# Patient Record
Sex: Female | Born: 1990 | Race: Black or African American | Hispanic: No | Marital: Married | Smoking: Current every day smoker
Health system: Southern US, Community
[De-identification: ages and names within clinical notes are randomized; demographics above are authoritative.]

## PROBLEM LIST (undated history)

## (undated) DIAGNOSIS — F419 Anxiety disorder, unspecified: Secondary | ICD-10-CM

## (undated) DIAGNOSIS — E119 Type 2 diabetes mellitus without complications: Secondary | ICD-10-CM

## (undated) HISTORY — DX: Type 2 diabetes mellitus without complications: E11.9

## (undated) HISTORY — PX: WISDOM TOOTH EXTRACTION: SHX21

## (undated) HISTORY — DX: Anxiety disorder, unspecified: F41.9

---
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
## 2018-11-06 ENCOUNTER — Telehealth: Payer: Self-pay | Admitting: Family Medicine

## 2018-11-06 ENCOUNTER — Ambulatory Visit (INDEPENDENT_AMBULATORY_CARE_PROVIDER_SITE_OTHER): Payer: PRIVATE HEALTH INSURANCE | Admitting: Family Medicine

## 2018-11-06 ENCOUNTER — Other Ambulatory Visit: Payer: Self-pay

## 2018-11-06 ENCOUNTER — Encounter: Payer: Self-pay | Admitting: Family Medicine

## 2018-11-06 DIAGNOSIS — N926 Irregular menstruation, unspecified: Secondary | ICD-10-CM | POA: Diagnosis not present

## 2018-11-06 DIAGNOSIS — R42 Dizziness and giddiness: Secondary | ICD-10-CM | POA: Diagnosis not present

## 2018-11-06 DIAGNOSIS — Z319 Encounter for procreative management, unspecified: Secondary | ICD-10-CM | POA: Insufficient documentation

## 2018-11-06 NOTE — Telephone Encounter (Signed)
Please schedule lab visit for patient  Thanks!  LG

## 2018-11-06 NOTE — Progress Notes (Signed)
Patient ID: Tara Rose, female   DOB: 12/25/1990, 28 y.o.   MRN: [id number]    Virtual Visit via video Note  This visit type was conducted due to national recommendations for restrictions regarding the COVID-19 pandemic (e.g. social distancing).  This format is felt to be most appropriate for this patient at this time.  All issues noted in this document were discussed and addressed.  No physical exam was performed (except for noted visual exam findings with Video Visits).   I connected with Tara Rose today at  8:40 AM EDT by a video enabled telemedicine application and verified that I am speaking with the correct person using two identifiers. Location patient: home Location provider: work or home office Persons participating in the virtual visit: patient, provider  I discussed the limitations, risks, security and privacy concerns of performing an evaluation and management service by telephone and the availability of in person appointments. I also discussed with the patient that there may be a patient responsible charge related to this service. The patient expressed understanding and agreed to proceed.   HPI:  Patient and I connected via video for her to establish with PCP.  Main concern today is irregular menstrual cycle and desire to become pregnant.  Patient has dealt with an irregular menstrual cycle for years.  States she did try an oral birth control for short time, but did not like how it made her feel so stopped taking.  States she will go 3 and 4 months without a cycle, then there will be times where she will have what seems to be a normal.  However she will spot for months after that.   Also reports some dizziness at times.  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  Upon straightening her neck she will feel dizziness wash over  her.  Usually will take some deep breaths drink some water and the dizziness will subside.  Denies any loss of visual field or extremity weakness, denies any facial weakness or speech difficulty denies any sharp pain in head.  No fever or chills.  No cough, shortness of breath or wheezing.  No chest pain.  No urinary problems.  No nausea, vomiting or diarrhea.  Past medical, social, surgical and family history reviewed and updated accordingly in chart.  ROS: See pertinent positives and negatives per HPI.  History reviewed. No pertinent past medical history.  History reviewed. No pertinent surgical history.  History reviewed. No pertinent family history.   Social History   Tobacco Use  . Smoking status: Former Games developermoker  . Smokeless tobacco: Never Used  Substance Use Topics  . Alcohol use: Never    Frequency: Never    EXAM:  GENERAL: alert, oriented, appears well and in no acute distress  HEENT: atraumatic, conjunttiva clear, no obvious abnormalities on inspection of external nose and ears  NECK: normal movements of the head and neck  LUNGS: on inspection no signs of respiratory distress, breathing rate appears normal, no obvious gross SOB, gasping or wheezing  CV: no obvious cyanosis  MS: moves all visible extremities without noticeable abnormality  PSYCH/NEURO: pleasant and cooperative, no obvious depression or anxiety, speech and thought processing grossly intact  ASSESSMENT AND PLAN:  Discussed the following assessment and plan:  Irregular menses/desire to become pregnant-advised patient there are different options  we can try including different formulations of birth control to help regulate the.  However if her goal is to become pregnant then I recommend seeing OB/GYN specialist for further work-up and direction on how best to approach pregnancy.  Dizziness-patient's description of dizziness does sound like a positional vertigo, and advised to try taking in a oral  nondrowsy antihistamine such as Claritin or Allegra daily to see if this reduces that symptom.  Also discussed keeping self well-hydrated and changing positions slowly.  We will also get blood work to further work-up other causes of dizziness.   I discussed the assessment and treatment plan with the patient. The patient was provided an opportunity to ask questions and all were answered. The patient agreed with the plan and demonstrated an understanding of the instructions.   The patient was advised to call back or seek an in-person evaluation if the symptoms worsen or if the condition fails to improve as anticipated.  30 minutes spent over video call with patient discussing possible causes of irregular menses and approaches we can take to help regulate the menstrual cycle as well as approaches for pregnancy.  Also discussed different medications to help reduce feelings of dizziness, patient is not a fan of taking a lot of medications, so she will be sure and keep self well-hydrated, change positions slowly and we will do blood work to rule out any other possible dizziness causes.   Jodelle Green, FNP

## 2018-11-06 NOTE — Telephone Encounter (Signed)
Appt scheduled for 11/08/18 @ 3:45pm

## 2018-11-08 ENCOUNTER — Other Ambulatory Visit: Payer: Self-pay

## 2018-11-08 ENCOUNTER — Other Ambulatory Visit (INDEPENDENT_AMBULATORY_CARE_PROVIDER_SITE_OTHER): Payer: PRIVATE HEALTH INSURANCE

## 2018-11-08 DIAGNOSIS — R42 Dizziness and giddiness: Secondary | ICD-10-CM | POA: Diagnosis not present

## 2018-11-08 DIAGNOSIS — E559 Vitamin D deficiency, unspecified: Secondary | ICD-10-CM

## 2018-11-08 NOTE — Addendum Note (Signed)
Addended by: WRIGHT, LATOYA S on: 11/08/2018 01:44 PM   Modules accepted: Orders  

## 2018-11-09 LAB — CBC WITH DIFFERENTIAL/PLATELET
Basophils Absolute: 0.1 10*3/uL (ref 0.0–0.2)
Basos: 0 %
EOS (ABSOLUTE): 0.2 10*3/uL (ref 0.0–0.4)
Eos: 1 %
Hematocrit: 41.7 % (ref 34.0–46.6)
Hemoglobin: 14.2 g/dL (ref 11.1–15.9)
Immature Grans (Abs): 0 10*3/uL (ref 0.0–0.1)
Immature Granulocytes: 0 %
Lymphocytes Absolute: 4.4 10*3/uL — ABNORMAL HIGH (ref 0.7–3.1)
Lymphs: 28 %
MCH: 27.6 pg (ref 26.6–33.0)
MCHC: 34.1 g/dL (ref 31.5–35.7)
MCV: 81 fL (ref 79–97)
Monocytes Absolute: 1 10*3/uL — ABNORMAL HIGH (ref 0.1–0.9)
Monocytes: 7 %
Neutrophils Absolute: 10.1 10*3/uL — ABNORMAL HIGH (ref 1.4–7.0)
Neutrophils: 64 %
Platelets: 355 10*3/uL (ref 150–450)
RBC: 5.14 x10E6/uL (ref 3.77–5.28)
RDW: 13.9 % (ref 11.7–15.4)
WBC: 15.9 10*3/uL — ABNORMAL HIGH (ref 3.4–10.8)

## 2018-11-09 LAB — B12 AND FOLATE PANEL
Folate: 18.6 ng/mL (ref >3.0)
Vitamin B-12: 692 pg/mL (ref 232–1245)

## 2018-11-09 LAB — COMPREHENSIVE METABOLIC PANEL
ALT: 34 IU/L — ABNORMAL HIGH (ref 0–32)
AST: 23 IU/L (ref 0–40)
Albumin/Globulin Ratio: 1.4 (ref 1.2–2.2)
Albumin: 4.8 g/dL (ref 3.9–5.0)
Alkaline Phosphatase: 86 IU/L (ref 39–117)
BUN/Creatinine Ratio: 10 (ref 9–23)
BUN: 7 mg/dL (ref 6–20)
Bilirubin Total: 0.5 mg/dL (ref 0.0–1.2)
CO2: 20 mmol/L (ref 20–29)
Calcium: 9.9 mg/dL (ref 8.7–10.2)
Chloride: 98 mmol/L (ref 96–106)
Creatinine, Ser: 0.67 mg/dL (ref 0.57–1.00)
GFR calc Af Amer: 141 mL/min/{1.73_m2} (ref >59)
GFR calc non Af Amer: 123 mL/min/{1.73_m2} (ref >59)
Globulin, Total: 3.4 g/dL (ref 1.5–4.5)
Glucose: 171 mg/dL — ABNORMAL HIGH (ref 65–99)
Potassium: 4 mmol/L (ref 3.5–5.2)
Sodium: 141 mmol/L (ref 134–144)
Total Protein: 8.2 g/dL (ref 6.0–8.5)

## 2018-11-09 LAB — VITAMIN D 25 HYDROXY (VIT D DEFICIENCY, FRACTURES): Vit D, 25-Hydroxy: 12.6 ng/mL — ABNORMAL LOW (ref 30.0–100.0)

## 2018-11-09 LAB — TSH: TSH: 1.85 u[IU]/mL (ref 0.450–4.500)

## 2018-11-11 ENCOUNTER — Encounter: Payer: Self-pay | Admitting: Family Medicine

## 2018-11-11 DIAGNOSIS — D72829 Elevated white blood cell count, unspecified: Secondary | ICD-10-CM

## 2018-11-11 MED ORDER — SULFAMETHOXAZOLE-TRIMETHOPRIM 800-160 MG PO TABS: 1.0000 | ORAL_TABLET | Freq: Two times a day (BID) | ORAL | 0 refills | Status: DC

## 2018-11-11 MED ORDER — VITAMIN D (ERGOCALCIFEROL) 1.25 MG (50000 UNIT) PO CAPS: 50000.0000 [IU] | ORAL_CAPSULE | ORAL | 0 refills | Status: DC

## 2018-11-11 NOTE — Addendum Note (Signed)
Addended by: Philis Nettle on: 11/11/2018 03:59 PM   Modules accepted: Orders

## 2018-11-11 NOTE — Telephone Encounter (Signed)
Appt scheduled 11/25/18 @ 3:00pm

## 2018-11-11 NOTE — Progress Notes (Addendum)
Vitamin D Rx

## 2018-11-11 NOTE — Telephone Encounter (Signed)
Please set up lab appt for 2 weeks from now  Thanks  LG

## 2018-11-25 ENCOUNTER — Encounter: Payer: Self-pay | Admitting: Family Medicine

## 2018-11-25 ENCOUNTER — Other Ambulatory Visit (INDEPENDENT_AMBULATORY_CARE_PROVIDER_SITE_OTHER): Payer: PRIVATE HEALTH INSURANCE

## 2018-11-25 ENCOUNTER — Other Ambulatory Visit: Payer: Self-pay

## 2018-11-25 DIAGNOSIS — D72829 Elevated white blood cell count, unspecified: Secondary | ICD-10-CM

## 2018-11-26 ENCOUNTER — Other Ambulatory Visit: Payer: Self-pay | Admitting: Family Medicine

## 2018-11-26 DIAGNOSIS — D72829 Elevated white blood cell count, unspecified: Secondary | ICD-10-CM

## 2018-11-26 LAB — CBC WITH DIFFERENTIAL/PLATELET
Basophils Absolute: 0.1 10*3/uL (ref 0.0–0.1)
Basophils Relative: 0.7 % (ref 0.0–3.0)
Eosinophils Absolute: 0.1 10*3/uL (ref 0.0–0.7)
Eosinophils Relative: 0.9 % (ref 0.0–5.0)
HCT: 42.4 % (ref 36.0–46.0)
Hemoglobin: 14.3 g/dL (ref 12.0–15.0)
Lymphocytes Relative: 28 % (ref 12.0–46.0)
Lymphs Abs: 4.1 10*3/uL — ABNORMAL HIGH (ref 0.7–4.0)
MCHC: 33.7 g/dL (ref 30.0–36.0)
MCV: 83.9 fl (ref 78.0–100.0)
Monocytes Absolute: 0.9 10*3/uL (ref 0.1–1.0)
Monocytes Relative: 6.1 % (ref 3.0–12.0)
Neutro Abs: 9.3 10*3/uL — ABNORMAL HIGH (ref 1.4–7.7)
Neutrophils Relative %: 64.3 % (ref 43.0–77.0)
Platelets: 319 10*3/uL (ref 150.0–400.0)
RBC: 5.05 Mil/uL (ref 3.87–5.11)
RDW: 14.3 % (ref 11.5–15.5)
WBC: 14.5 10*3/uL — ABNORMAL HIGH (ref 4.0–10.5)

## 2018-11-27 ENCOUNTER — Ambulatory Visit (INDEPENDENT_AMBULATORY_CARE_PROVIDER_SITE_OTHER): Payer: PRIVATE HEALTH INSURANCE | Admitting: Certified Nurse Midwife

## 2018-11-27 ENCOUNTER — Other Ambulatory Visit: Payer: Self-pay

## 2018-11-27 ENCOUNTER — Encounter: Payer: Self-pay | Admitting: Certified Nurse Midwife

## 2018-11-27 VITALS — BP 128/90 | HR 106 | Ht 61.0 in | Wt 192.4 lb

## 2018-11-27 DIAGNOSIS — N970 Female infertility associated with anovulation: Secondary | ICD-10-CM | POA: Diagnosis not present

## 2018-11-27 NOTE — Progress Notes (Signed)
Subjective:    Tara Rose is a 28 y.o. female who presents for evaluation of infertility. Patient and partner have been attempting conception for 6 years. Marital status: married for 6 years. Pregnancies with current partner: no. Body mass index is 36.35 kg/m.  Menstrual and Endocrine History LMP Patient's last menstrual period was 11/16/2018 (exact date).  Menarche 12        Duration of flow week long with spotting sometimes for months days  Heavy menses no  Clots yes  Intermenstrual bleeding yes  Postcoital bleeding yes  Dysmenorrhea no  Amenorrhea Skip 1- 5 months  Weight change yes, started at 18  Hirsutism no  Balding no  Acne no  Galactorrhea no   Obstetrical History Never pregnant  Gynecologic History Last PAP Never had one  Previous abdominal or pelvic surgery no  Pelvic pain no  Endometriosis no  Hot flashes no  DES exposure no  Abnormal Pap never had pap  Cervix Cryo/cone no  Sexually transmitted diseases no  Pelvic inflammatory disease no   Infertility and Endocrine Studies None   Contraception None   Habits Cigarettes:    Wife -  in the past, not current    Husband - yes Alcohol:    Wife -  no    Husband - no Marijuana:   Wife - no   Husband - no  The following portions of the patient's history were reviewed and updated as appropriate: allergies, current medications, past family history, past medical history, past social history, past surgical history and problem list.  Review of Systems Pertinent items are noted in HPI.   Marital History: married # of years with this partner: 6 # of partners: a few            Objective:    Female Exam BP 128/90   Pulse (!) 106   Ht '5\' 1"'  (1.549 m)   Wt 192 lb 6 oz (87.3 kg)   LMP 11/16/2018 (Exact Date)   BMI 36.35 kg/m  Wt Readings from Last 1 Encounters:  11/27/18 192 lb 6 oz (87.3 kg)   BMI: Body mass index is 36.35 kg/m. No exam performed today, not indicated.  .   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   Plan:    labs and ultrasound ordered today.   Encourage pt partner to have semen analysis. Discussed use of ovulation kit and timing of intercourse. Will follow up with results. Discussed causes of irregular periods including PCOS. Discussed plan of care and use of clomid to induce ovulation . She verbalizes and agrees to plan. I offered her a referral to infertility but she would like to try plan as discussed piror to seeking infertility specialist .  I attest that more than 50% of visit spent reviewing history, discussing cause of irregular periods and infertility , developing a plan of care, and answering all of her questions. Face to face time 20 min.   Philip Aspen, CNm

## 2018-11-27 NOTE — Patient Instructions (Signed)
Female Infertility  Female infertility refers to a woman's inability to get pregnant (conceive) after a year of having sex regularly (or after 6 months in women over age 26) without using birth control. Infertility can also mean that a woman is not able to carry a pregnancy to full term. Both women and men can have fertility problems. What are the causes? This condition may be caused by:  Problems with reproductive organs. Infertility can result if a woman: ? Has an abnormally short cervix or a cervix that does not remain closed during a pregnancy. ? Has a blockage or scarring in the fallopian tubes. ? Has an abnormally shaped uterus. ? Has uterine fibroids. This is a benign mass of tissue or muscle (tumor) that can develop in the uterus. ? Is not ovulating in a regular way.  Certain medical conditions. These may include: ? Polycystic ovary syndrome (PCOS). This is a hormonal disorder that can cause small cysts to grow on the ovaries. This is the most common cause of infertility in women. ? Endometriosis. This is a condition in which the tissue that lines the uterus (endometrium) grows outside of its normal location. ? Cancer and cancer treatments, such as chemotherapy or radiation. ? Premature ovarian failure. This is when ovaries stop producing eggs and hormones before age 40. ? Sexually transmitted diseases, such as chlamydia or gonorrhea. ? Autoimmune disorders. These are disorders in which the body's defense system (immune system) attacks normal, healthy cells. Infertility can be linked to more than one cause. For some women, the cause of infertility is not known (unexplained infertility). What increases the risk?  Age. A woman's fertility declines with age, especially after her mid-30s.  Being underweight or overweight.  Drinking too much alcohol.  Using drugs such as anabolic steroids, cocaine, and marijuana.  Exercising excessively.  Being exposed to environmental toxins,  such as radiation, pesticides, and certain chemicals. What are the signs or symptoms? The main sign of infertility in women is the inability to get pregnant or carry a pregnancy to full term. How is this diagnosed? This condition may be diagnosed by:  Checking whether you are ovulating each month. The tests may include: ? Blood tests to check hormone levels. ? An ultrasound of the ovaries. ? Taking a small tissue that lines the uterus and checking it under a microscope (endometrial biopsy).  Doing additional tests. This is done if ovulation is normal. Tests may include: ? Hysterosalpingography. This X-ray test can show the shape of the uterus and whether the fallopian tubes are open. ? Laparoscopy. This test uses a lighted tube (laparoscope) to look for problems in the fallopian tubes and other organs. ? Transvaginal ultrasound. This imaging test is used to check for abnormalities in the uterus and ovaries. ? Hysteroscopy. This test uses a lighted tube to check for problems in the cervix and the uterus. To be diagnosed with infertility, both partners will have a physical exam. Both partners will also have an extensive medical and sexual history taken. Additional tests may be done. How is this treated? Treatment depends on the cause of infertility. Most cases of infertility in women are treated with medicine or surgery.  Women may take medicine to: ? Correct ovulation problems. ? Treat other health conditions.  Surgery may be done to: ? Repair damage to the ovaries, fallopian tubes, cervix, or uterus. ? Remove growths from the uterus. ? Remove scar tissue from the uterus, pelvis, or other organs. Assisted reproductive technology (ART) Assisted reproductive technology (  ART) refers to all treatments and procedures that combine eggs and sperm outside the body to try to help a couple conceive. ART is often combined with fertility drugs to stimulate ovulation. Sometimes ART is done using eggs  retrieved from another woman's body (donor eggs) or from previously frozen fertilized eggs (embryos). There are different types of ART. These include:  Intrauterine insemination (IUI). A long, thin tube is used to place sperm directly into a woman's uterus. This procedure: ? Is effective for infertility caused by sperm problems, including low sperm count and low motility. ? Can be used in combination with fertility drugs.  In vitro fertilization (IVF). This is done when a woman's fallopian tubes are blocked or when a man has low sperm count. In this procedure: ? Fertility drugs are used to stimulate the ovaries to produce multiple eggs. ? Once mature, these eggs are removed from the body and combined with the sperm to be fertilized. ? The fertilized eggs are then placed into the woman's uterus. Follow these instructions at home:  Take over-the-counter and prescription medicines only as told by your health care provider.  Do not use any products that contain nicotine or tobacco, such as cigarettes and e-cigarettes. If you need help quitting, ask your health care provider.  If you drink alcohol, limit how much you have to 1 drink a day.  Make dietary changes to lose weight or maintain a healthy weight. Work with your health care provider and a dietitian to set a weight-loss goal that is healthy and reasonable for you.  Seek support from a counselor or support group to talk about your concerns related to infertility. Couples counseling may be helpful for you and your partner.  Practice stress reduction techniques that work well for you, such as regular physical activity, meditation, or deep breathing.  Keep all follow-up visits as told by your health care provider. This is important. Contact a health care provider if you:  Feel that stress is interfering with your life and relationships.  Have side effects from treatments for infertility. Summary  Female infertility refers to a woman's  inability to get pregnant (conceive) after a year of having sex regularly (or after 6 months in women over age 26) without using birth control.  To be diagnosed with infertility, both partners will have a physical exam. Both partners will also have an extensive medical and sexual history taken.  Seek support from a counselor or support group to talk about your concerns related to infertility. Couples counseling may be helpful for you and your partner. This information is not intended to replace advice given to you by your health care provider. Make sure you discuss any questions you have with your health care provider. Document Released: 04/06/2003 Document Revised: 07/25/2018 Document Reviewed: 03/05/2017 Elsevier Patient Education  2020 Elsevier Inc.  

## 2018-11-28 ENCOUNTER — Ambulatory Visit (INDEPENDENT_AMBULATORY_CARE_PROVIDER_SITE_OTHER): Payer: PRIVATE HEALTH INSURANCE

## 2018-11-28 DIAGNOSIS — N970 Female infertility associated with anovulation: Secondary | ICD-10-CM

## 2018-11-29 ENCOUNTER — Other Ambulatory Visit: Payer: Self-pay

## 2018-11-29 ENCOUNTER — Other Ambulatory Visit: Payer: PRIVATE HEALTH INSURANCE

## 2018-11-29 DIAGNOSIS — N970 Female infertility associated with anovulation: Secondary | ICD-10-CM

## 2018-11-30 LAB — FSH/LH
FSH: 7.8 m[IU]/mL
LH: 12.7 m[IU]/mL

## 2018-11-30 LAB — CBC
Hematocrit: 42.5 % (ref 34.0–46.6)
Hemoglobin: 14.5 g/dL (ref 11.1–15.9)
MCH: 27.5 pg (ref 26.6–33.0)
MCHC: 34.1 g/dL (ref 31.5–35.7)
MCV: 81 fL (ref 79–97)
Platelets: 335 10*3/uL (ref 150–450)
RBC: 5.28 x10E6/uL (ref 3.77–5.28)
RDW: 14 % (ref 11.7–15.4)
WBC: 15.6 10*3/uL — ABNORMAL HIGH (ref 3.4–10.8)

## 2018-11-30 LAB — PROLACTIN: Prolactin: 13.3 ng/mL (ref 4.8–23.3)

## 2018-11-30 LAB — TESTOSTERONE: Testosterone: 48 ng/dL (ref 8–48)

## 2018-12-11 ENCOUNTER — Other Ambulatory Visit (INDEPENDENT_AMBULATORY_CARE_PROVIDER_SITE_OTHER): Payer: PRIVATE HEALTH INSURANCE

## 2018-12-11 ENCOUNTER — Other Ambulatory Visit: Payer: Self-pay

## 2018-12-11 DIAGNOSIS — D72829 Elevated white blood cell count, unspecified: Secondary | ICD-10-CM

## 2018-12-12 ENCOUNTER — Encounter: Payer: Self-pay | Admitting: Family Medicine

## 2018-12-12 DIAGNOSIS — D72829 Elevated white blood cell count, unspecified: Secondary | ICD-10-CM

## 2018-12-12 LAB — CBC WITH DIFFERENTIAL/PLATELET
Basophils Absolute: 0.2 10*3/uL — ABNORMAL HIGH (ref 0.0–0.1)
Basophils Relative: 1.1 % (ref 0.0–3.0)
Eosinophils Absolute: 0.3 10*3/uL (ref 0.0–0.7)
Eosinophils Relative: 2 % (ref 0.0–5.0)
HCT: 40.1 % (ref 36.0–46.0)
Hemoglobin: 13.6 g/dL (ref 12.0–15.0)
Lymphocytes Relative: 31 % (ref 12.0–46.0)
Lymphs Abs: 4.4 10*3/uL — ABNORMAL HIGH (ref 0.7–4.0)
MCHC: 33.9 g/dL (ref 30.0–36.0)
MCV: 83.2 fl (ref 78.0–100.0)
Monocytes Absolute: 0.8 10*3/uL (ref 0.1–1.0)
Monocytes Relative: 5.7 % (ref 3.0–12.0)
Neutro Abs: 8.6 10*3/uL — ABNORMAL HIGH (ref 1.4–7.7)
Neutrophils Relative %: 60.2 % (ref 43.0–77.0)
Platelets: 311 10*3/uL (ref 150.0–400.0)
RBC: 4.82 Mil/uL (ref 3.87–5.11)
RDW: 14.2 % (ref 11.5–15.5)
WBC: 14.2 10*3/uL — ABNORMAL HIGH (ref 4.0–10.5)

## 2018-12-17 ENCOUNTER — Telehealth: Payer: Self-pay | Admitting: Family Medicine

## 2018-12-17 NOTE — Telephone Encounter (Signed)
I spoke with pt to schedule her CBC non-fasting lab appt in two weeks. Pt states she does not know what her schedule will be and will call back to schedule.

## 2019-01-26 ENCOUNTER — Other Ambulatory Visit: Payer: Self-pay | Admitting: Family Medicine

## 2019-01-26 DIAGNOSIS — E559 Vitamin D deficiency, unspecified: Secondary | ICD-10-CM

## 2019-03-08 ENCOUNTER — Other Ambulatory Visit: Payer: Self-pay

## 2019-03-08 DIAGNOSIS — Z20822 Contact with and (suspected) exposure to covid-19: Secondary | ICD-10-CM

## 2019-03-09 ENCOUNTER — Other Ambulatory Visit: Payer: Self-pay

## 2019-03-09 ENCOUNTER — Encounter (INDEPENDENT_AMBULATORY_CARE_PROVIDER_SITE_OTHER): Payer: Self-pay

## 2019-03-09 ENCOUNTER — Emergency Department: Payer: PRIVATE HEALTH INSURANCE

## 2019-03-09 ENCOUNTER — Emergency Department
Admission: EM | Admit: 2019-03-09 | Discharge: 2019-03-09 | Disposition: A | Discharge status: Home / Self Care | Payer: PRIVATE HEALTH INSURANCE | Attending: Emergency Medicine | Admitting: Emergency Medicine

## 2019-03-09 ENCOUNTER — Telehealth: Payer: PRIVATE HEALTH INSURANCE | Admitting: Family

## 2019-03-09 ENCOUNTER — Encounter: Payer: Self-pay | Admitting: *Deleted

## 2019-03-09 DIAGNOSIS — Z20822 Contact with and (suspected) exposure to covid-19: Secondary | ICD-10-CM

## 2019-03-09 DIAGNOSIS — J069 Acute upper respiratory infection, unspecified: Secondary | ICD-10-CM | POA: Insufficient documentation

## 2019-03-09 DIAGNOSIS — Z87891 Personal history of nicotine dependence: Secondary | ICD-10-CM | POA: Insufficient documentation

## 2019-03-09 DIAGNOSIS — Z20828 Contact with and (suspected) exposure to other viral communicable diseases: Secondary | ICD-10-CM

## 2019-03-09 MED ORDER — ALBUTEROL SULFATE HFA 108 (90 BASE) MCG/ACT IN AERS: 2.0000 | INHALATION_SPRAY | Freq: Four times a day (QID) | RESPIRATORY_TRACT | 0 refills | Status: DC | PRN

## 2019-03-09 MED ORDER — ACETAMINOPHEN 500 MG PO TABS
1000.0000 mg | ORAL_TABLET | Freq: Once | ORAL | Status: AC
  Administered 2019-03-09: 1000 mg via ORAL

## 2019-03-09 MED ORDER — BENZONATATE 100 MG PO CAPS: 100.0000 mg | ORAL_CAPSULE | Freq: Three times a day (TID) | ORAL | 0 refills | Status: DC | PRN

## 2019-03-09 MED ORDER — ACETAMINOPHEN 500 MG PO TABS
ORAL_TABLET | ORAL | Status: AC
  Filled 2019-03-09: qty 2

## 2019-03-09 NOTE — ED Notes (Signed)
Concerned for COVID exposure, needs testing, husband also here for same. C/o cough, fever, HA

## 2019-03-09 NOTE — Progress Notes (Signed)
E-Visit for Corona Virus Screening   Your current symptoms could be consistent with the coronavirus.  Many health care providers can now test patients at their office but not all are.  [facility] has multiple testing sites. For information on our COVID testing locations and hours go to [email or web address]  Please quarantine yourself while awaiting your test results.  We are enrolling you in our MyChart Home [facility] for COVID19 . Daily you will receive a questionnaire within the MyChart website. Our COVID 19 response team willl be monitoriing your responses daily. Please continue good preventive care measures, including:  frequent hand-washing, avoid touching your face, cover coughs/sneezes, stay out of crowds and keep a 6 foot distance from others.    You can go to one of the testing sites listed below, while they are opened (see hours). You do not need an order and will stay in your car during the test. You do need to self isolate until your results return and if positive 14 days from when your symptoms started and until you are 3 days fever free.   Testing Locations (Monday - Friday, 10 a.m. - 3 p.m.) . [place]: [facility], [address], [place], [place]  . [place]: [facility], [address], [place], [place] (entrance off [address])  . [place]: (Closed each Monday): Testing site relocated to the short stay covered drive at [facility]. (Use the [address] entrance to [facility] next to [facility].)    COVID-19 is a respiratory illness with symptoms that are similar to the flu. Symptoms are typically mild to moderate, but there have been cases of severe illness and death due to the virus. The following symptoms may appear 2-14 days after exposure: . Fever . Cough . Shortness of breath or difficulty breathing . Chills . Repeated shaking with  chills . Muscle pain . Headache . Sore throat . New loss of taste or smell . Fatigue . Congestion or runny nose . Nausea or vomiting . Diarrhea  If you develop fever/cough/breathlessness, please stay home for 10 days with improving symptoms and until you have had 24 hours of no fever (without taking a fever reducer).  Go to the nearest hospital ED for assessment if fever/cough/breathlessness are severe or illness seems like a threat to life.  It is vitally important that if you feel that you have an infection such as this virus or any other virus that you stay home and away from places where you may spread it to others.  You should avoid contact with people age 65 and older.   You should wear a mask or cloth face covering over your nose and mouth if you must be around other people or animals, including pets (even at home). Try to stay at least 6 feet away from other people. This will protect the people around you.  You can use medication such as A prescription cough medication called Tessalon Perles 100 mg. You may take 1-2 capsules every 8 hours as needed for cough and A prescription inhaler called Albuterol MDI 90 mcg /actuation 2 puffs every 4 hours as needed for shortness of breath, wheezing, cough.  You may also take acetaminophen (Tylenol) as needed for fever.   Reduce your risk of any infection by using the same precautions used for avoiding the common cold or flu:  . Wash your hands often with soap and warm water for at least 20 seconds.    If soap and water are not readily available, use an alcohol-based hand sanitizer with at least 60% alcohol.  . If coughing or sneezing, cover your mouth and nose by coughing or sneezing into the elbow areas of your shirt or coat, into a tissue or into your sleeve (not your hands). . Avoid shaking hands with others and consider head nods or verbal greetings only. . Avoid touching your eyes, nose, or mouth with unwashed hands.  . Avoid close contact  with people who are sick. . Avoid places or events with large numbers of people in one location, like concerts or sporting events. . Carefully consider travel plans you have or are making. . If you are planning any travel outside or inside the [place], visit the CDC's Travelers' Health webpage for the latest health notices. . If you have some symptoms but not all symptoms, continue to monitor at home and seek medical attention if your symptoms worsen. . If you are having a medical emergency, call 911.  HOME CARE . Only take medications as instructed by your medical team. . Drink plenty of fluids and get plenty of rest. . A steam or ultrasonic humidifier can help if you have congestion.   GET HELP RIGHT AWAY IF YOU HAVE EMERGENCY WARNING SIGNS** FOR COVID-19. If you or someone is showing any of these signs seek emergency medical care immediately. Call 911 or proceed to your closest emergency facility if: . You develop worsening high fever. . Trouble breathing . Bluish lips or face . Persistent pain or pressure in the chest . New confusion . Inability to wake or stay awake . You cough up blood. . Your symptoms become more severe  **This list is not all possible symptoms. Contact your medical provider for any symptoms that are sever or concerning to you.   MAKE SURE YOU   Understand these instructions.  Will watch your condition.  Will get help right away if you are not doing well or get worse.  Your e-visit answers were reviewed by a board certified advanced clinical practitioner to complete your personal care plan.  Depending on the condition, your plan could have included both over the counter or prescription medications.  If there is a problem please reply once you have received a response from your provider.  Your safety is important to us.  If you have drug allergies check your prescription carefully.    You can use MyChart to ask questions about today's visit, request a non-urgent  call back, or ask for a work or school excuse for 24 hours related to this e-Visit. If it has been greater than 24 hours you will need to follow up with your provider, or enter a new e-Visit to address those concerns. You will get an e-mail in the next two days asking about your experience.  I hope that your e-visit has been valuable and will speed your recovery. Thank you for using e-visits.   Approximately 5 minutes was spent documenting and reviewing patient's chart.   

## 2019-03-09 NOTE — ED Triage Notes (Signed)
Pt reports exposure to covid.  Pt has intermittent fevers, cough, h/a's.  Pt alert

## 2019-03-10 ENCOUNTER — Encounter (INDEPENDENT_AMBULATORY_CARE_PROVIDER_SITE_OTHER): Payer: Self-pay

## 2019-03-10 ENCOUNTER — Telehealth: Payer: Self-pay

## 2019-03-10 LAB — NOVEL CORONAVIRUS, NAA: SARS-CoV-2, NAA: DETECTED — AB

## 2019-03-10 NOTE — Telephone Encounter (Signed)
Received call from patient checking Covid results.  Advised results are positive.  Advised to contact PCP or visit ED if symptoms worsen.   

## 2019-03-11 ENCOUNTER — Encounter (INDEPENDENT_AMBULATORY_CARE_PROVIDER_SITE_OTHER): Payer: Self-pay

## 2019-03-12 ENCOUNTER — Encounter (INDEPENDENT_AMBULATORY_CARE_PROVIDER_SITE_OTHER): Payer: Self-pay

## 2019-03-12 ENCOUNTER — Telehealth: Payer: Self-pay

## 2019-03-12 NOTE — Telephone Encounter (Signed)
Left a vm for patient to callback in regards to [employer].

## 2019-03-13 ENCOUNTER — Encounter (INDEPENDENT_AMBULATORY_CARE_PROVIDER_SITE_OTHER): Payer: Self-pay

## 2019-03-14 ENCOUNTER — Encounter (INDEPENDENT_AMBULATORY_CARE_PROVIDER_SITE_OTHER): Payer: Self-pay

## 2019-03-15 ENCOUNTER — Encounter (INDEPENDENT_AMBULATORY_CARE_PROVIDER_SITE_OTHER): Payer: Self-pay

## 2019-03-17 ENCOUNTER — Encounter (INDEPENDENT_AMBULATORY_CARE_PROVIDER_SITE_OTHER): Payer: Self-pay

## 2019-03-18 ENCOUNTER — Encounter (INDEPENDENT_AMBULATORY_CARE_PROVIDER_SITE_OTHER): Payer: Self-pay

## 2019-03-20 ENCOUNTER — Encounter (INDEPENDENT_AMBULATORY_CARE_PROVIDER_SITE_OTHER): Payer: Self-pay

## 2019-03-21 ENCOUNTER — Encounter (INDEPENDENT_AMBULATORY_CARE_PROVIDER_SITE_OTHER): Payer: Self-pay

## 2019-04-11 NOTE — ED Provider Notes (Signed)
Emergency Department Provider Note  ____________________________________________  Time seen: Approximately 3:47 PM  I have reviewed the triage vital signs and the nursing notes.   HISTORY  Chief Complaint Cough   Historian Patient    HPI Tara Rose is a 28 y.o. female presents to the emergency department with rhinorrhea, nasal congestion nonproductive cough for the past 1 to 2 days.  Patient's husband has similar symptoms.  Patient is requesting COVID-19 testing.  She denies chest pain, chest tightness or abdominal pain.   No past medical history on file.   Immunizations up to date:  Yes.     No past medical history on file.  Patient Active Problem List   Diagnosis Date Noted  . Irregular menses 11/06/2018  . Desire for pregnancy 11/06/2018    Past Surgical History:  Procedure Laterality Date  . WISDOM TOOTH EXTRACTION      Prior to Admission medications   Medication Sig Start Date End Date Taking? Authorizing Provider  albuterol (VENTOLIN HFA) 108 (90 Base) MCG/ACT inhaler Inhale 2 puffs into the lungs every 6 (six) hours as needed for wheezing or shortness of breath. 03/09/19   Junie Spencer, FNP  benzonatate (TESSALON PERLES) 100 MG capsule Take 1 capsule (100 mg total) by mouth 3 (three) times daily as needed. 03/09/19   Junie Spencer, FNP  chlorhexidine (PERIDEX) 0.12 % solution SWISH AND SPIT 15 ML PO BID FOR 30 SECONDS 11/02/18   [provider]  metroNIDAZOLE (FLAGYL) 500 MG tablet TK 1 T PO QID FOR 7 DAYS 11/23/18   [provider]  Vitamin D, Ergocalciferol, (DRISDOL) 1.25 MG (50000 UT) CAPS capsule TAKE 1 CAPSULE (50,000 UNITS TOTAL) BY MOUTH EVERY 7 (SEVEN) DAYS. 01/27/19   Tracey Harries, FNP    Allergies Patient has no known allergies.  Family History  Problem Relation Age of Onset  . Seizures Brother   . Cancer Maternal Grandmother   . Cancer Maternal Grandfather     Social History Social History   Tobacco Use   . Smoking status: Former Games developer  . Smokeless tobacco: Never Used  Substance Use Topics  . Alcohol use: Never  . Drug use: Never     Review of Systems  Constitutional: Patient has fever.  Eyes: No visual changes. No discharge ENT: Patient has congestion.  Cardiovascular: no chest pain. Respiratory: Patient has cough.  Gastrointestinal: No abdominal pain.  No nausea, no vomiting. Patient had diarrhea.  Genitourinary: Negative for dysuria. No hematuria Musculoskeletal: Patient has myalgias.  Skin: Negative for rash, abrasions, lacerations, ecchymosis. Neurological: Patient has headache, no focal weakness or numbness.     ____________________________________________   PHYSICAL EXAM:  VITAL SIGNS: ED Triage Vitals  Enc Vitals Group     BP 03/09/19 1753 122/84     Pulse Rate 03/09/19 1753 (!) 108     Resp 03/09/19 1753 20     Temp 03/09/19 1753 (!) 100.4 F (38 C)     Temp Source 03/09/19 2208 Oral     SpO2 03/09/19 1753 98 %     Weight 03/09/19 1754 184 lb (83.5 kg)     Height 03/09/19 1754 5\' 2"  (1.575 m)     Head Circumference --      Peak Flow --      Pain Score 03/09/19 1754 6     Pain Loc --      Pain Edu? --      Excl. in GC? --  Constitutional: Alert and oriented. Patient is lying supine. Eyes: Conjunctivae are normal. PERRL. EOMI. Head: Atraumatic. ENT:      Ears: Tympanic membranes are mildly injected with mild effusion bilaterally.       Nose: No congestion/rhinnorhea.      Mouth/Throat: Mucous membranes are moist. Posterior pharynx is mildly erythematous.  Hematological/Lymphatic/Immunilogical: No cervical lymphadenopathy.  Cardiovascular: Normal rate, regular rhythm. Normal S1 and S2.  Good peripheral circulation. Respiratory: Normal respiratory effort without tachypnea or retractions. Lungs CTAB. Good air entry to the bases with no decreased or absent breath sounds. Gastrointestinal: Bowel sounds 4 quadrants. Soft and nontender to palpation.  No guarding or rigidity. No palpable masses. No distention. No CVA tenderness. Musculoskeletal: Full range of motion to all extremities. No gross deformities appreciated. Neurologic:  Normal speech and language. No gross focal neurologic deficits are appreciated.  Skin:  Skin is warm, dry and intact. No rash noted. Psychiatric: Mood and affect are normal. Speech and behavior are normal. Patient exhibits appropriate insight and judgement.    ____________________________________________   LABS (all labs ordered are listed, but only abnormal results are displayed)  Labs Reviewed - No data to display ____________________________________________  EKG   ____________________________________________  RADIOLOGY   No results found.  ____________________________________________    PROCEDURES  Procedure(s) performed:     Procedures     Medications  acetaminophen (TYLENOL) tablet 1,000 mg (1,000 mg Oral Given 03/09/19 1758)     ____________________________________________   INITIAL IMPRESSION / ASSESSMENT AND PLAN / ED COURSE  Pertinent labs & imaging results that were available during my care of the patient were reviewed by me and considered in my medical decision making (see chart for details).      Assessment and plan Viral URI with cough 28 year old female presents to the emergency department with rhinorrhea, nasal congestion nonproductive cough.   Vital signs are reassuring at triage.  No increased work of breathing on physical exam or adventitious lung sounds auscultated.  No consolidations, opacities or infiltrates that would suggest community-acquired pneumonia on chest x-ray.  COVID-19 testing is pending at this time.  Strict return precautions were given to return to the emergency department with new or worsening symptoms.  All patient questions were answered.  Tara Rose was evaluated in Emergency Department on 04/11/2019 for the symptoms described  in the history of present illness. She was evaluated in the context of the global COVID-19 pandemic, which necessitated consideration that the patient might be at risk for infection with the SARS-CoV-2 virus that causes COVID-19. Institutional protocols and algorithms that pertain to the evaluation of patients at risk for COVID-19 are in a state of rapid change based on information released by regulatory bodies including the CDC and federal and state organizations. These policies and algorithms were followed during the patient's care in the ED.   ____________________________________________  FINAL CLINICAL IMPRESSION(S) / ED DIAGNOSES  Final diagnoses:  Viral upper respiratory tract infection      NEW MEDICATIONS STARTED DURING THIS VISIT:  ED Discharge Orders    None          This chart was dictated using voice recognition software/Dragon. Despite best efforts to proofread, errors can occur which can change the meaning. Any change was purely unintentional.     Lannie Fields, PA-C 04/11/19 1550    Vanessa [place], MD 04/11/19 [phone]

## 2020-08-09 ENCOUNTER — Telehealth: Payer: PRIVATE HEALTH INSURANCE | Admitting: Physician Assistant

## 2020-08-09 ENCOUNTER — Encounter: Payer: Self-pay | Admitting: Physician Assistant

## 2020-08-09 DIAGNOSIS — R682 Dry mouth, unspecified: Secondary | ICD-10-CM | POA: Diagnosis not present

## 2020-08-09 NOTE — Patient Instructions (Signed)
Biotene mouthwash, toothpaste, and/or lozenges

## 2020-08-09 NOTE — Progress Notes (Signed)
Ms. nissi, doffing are scheduled for a virtual visit with your provider today.    Just as we do with appointments in the office, we must obtain your consent to participate.  Your consent will be active for this visit and any virtual visit you may have with one of our providers in the next 365 days.    If you have a MyChart account, I can also send a copy of this consent to you electronically.  All virtual visits are billed to your insurance company just like a traditional visit in the office.  As this is a virtual visit, video technology does not allow for your provider to perform a traditional examination.  This may limit your provider's ability to fully assess your condition.  If your provider identifies any concerns that need to be evaluated in person or the need to arrange testing such as labs, EKG, etc, we will make arrangements to do so.    Although advances in technology are sophisticated, we cannot ensure that it will always work on either your end or our end.  If the connection with a video visit is poor, we may have to switch to a telephone visit.  With either a video or telephone visit, we are not always able to ensure that we have a secure connection.   I need to obtain your verbal consent now.   Are you willing to proceed with your visit today?   FORREST DEMURO has provided verbal consent on 08/09/2020 for a virtual visit (video or telephone).   Margaretann Loveless, PA-C 08/09/2020  5:29 PM   MyChart Video Visit    Virtual Visit via Video Note   This visit type was conducted due to national recommendations for restrictions regarding the COVID-19 Pandemic (e.g. social distancing) in an effort to limit this patient's exposure and mitigate transmission in our community. This patient is at least at moderate risk for complications without adequate follow up. This format is felt to be most appropriate for this patient at this time. Physical exam was limited by quality of the video and audio  technology used for the visit.   Patient location: Home Provider location: Home office in [place]  I discussed the limitations of evaluation and management by telemedicine and the availability of in person appointments. The patient expressed understanding and agreed to proceed.  Patient: Tara Rose   DOB: 06-Sep-1990   30 y.o. Female  MRN: [id number] Visit Date: 08/09/2020  Today's healthcare provider: Margaretann Loveless, PA-C   No chief complaint on file.  Subjective    HPI  Mikayela Deats is a 30 yr old female that presents today via [facility] for dry sensation on her cheeks. She reports she has had something similar in the past, but has never occurred on both cheeks at the same time and lasted as long. Current episode has lasted longer than 1 week. She does have a history of periodontal infections and has chlorhexidine mouthwash. She started using this to see if it would help, but there has been no change. She denies any medication changes. She does report she had amoxicillin, but that was over a month ago. Does report that the inside of her cheeks feel raw, like she has been chewing on them while she is sleeping. Then the inside feels rough and dry. No other areas of the mouth are experiencing this. She has tried increasing fluids without improvement.  Patient Active Problem List   Diagnosis Date Noted  . Irregular  menses 11/06/2018  . Desire for pregnancy 11/06/2018   No past medical history on file.    Medications: Outpatient Medications Prior to Visit  Medication Sig  . albuterol (VENTOLIN HFA) 108 (90 Base) MCG/ACT inhaler Inhale 2 puffs into the lungs every 6 (six) hours as needed for wheezing or shortness of breath.  . benzonatate (TESSALON PERLES) 100 MG capsule Take 1 capsule (100 mg total) by mouth 3 (three) times daily as needed.  . chlorhexidine (PERIDEX) 0.12 % solution SWISH AND SPIT 15 ML PO BID FOR 30 SECONDS  . metroNIDAZOLE (FLAGYL) 500 MG tablet TK 1 T  PO QID FOR 7 DAYS  . Vitamin D, Ergocalciferol, (DRISDOL) 1.25 MG (50000 UT) CAPS capsule TAKE 1 CAPSULE (50,000 UNITS TOTAL) BY MOUTH EVERY 7 (SEVEN) DAYS.   No facility-administered medications prior to visit.    Review of Systems  Constitutional: Negative.   HENT: Positive for mouth sores.   Respiratory: Negative.   Cardiovascular: Negative.   Neurological: Negative.     Last CBC Lab Results  Component Value Date   WBC 14.2 (H) 12/11/2018   HGB 13.6 12/11/2018   HCT 40.1 12/11/2018   MCV 83.2 12/11/2018   MCH 27.5 11/29/2018   RDW 14.2 12/11/2018   PLT 311.0 12/11/2018   Last metabolic panel Lab Results  Component Value Date   GLUCOSE 171 (H) 11/08/2018   NA 141 11/08/2018   K 4.0 11/08/2018   CL 98 11/08/2018   CO2 20 11/08/2018   BUN 7 11/08/2018   CREATININE 0.67 11/08/2018   GFRNONAA 123 11/08/2018   GFRAA 141 11/08/2018   CALCIUM 9.9 11/08/2018   PROT 8.2 11/08/2018   ALBUMIN 4.8 11/08/2018   LABGLOB 3.4 11/08/2018   AGRATIO 1.4 11/08/2018   BILITOT 0.5 11/08/2018   ALKPHOS 86 11/08/2018   AST 23 11/08/2018   ALT 34 (H) 11/08/2018      Objective    There were no vitals taken for this visit. BP Readings from Last 3 Encounters:  03/09/19 112/74  11/27/18 128/90   Wt Readings from Last 3 Encounters:  03/09/19 184 lb (83.5 kg)  11/27/18 192 lb 6 oz (87.3 kg)      Physical Exam Vitals reviewed.  Constitutional:      General: She is not in acute distress.    Appearance: Normal appearance. She is well-developed. She is not ill-appearing.  HENT:     Head: Normocephalic and atraumatic.  Pulmonary:     Effort: Pulmonary effort is normal. No respiratory distress.  Musculoskeletal:     Cervical back: Normal range of motion and neck supple.  Neurological:     Mental Status: She is alert.  Psychiatric:        Behavior: Behavior normal.        Thought Content: Thought content normal.        Judgment: Judgment normal.        Assessment & Plan      1. Dry mouth - Unknown source, discussed many causes and she should be evaluated in person by her dentist or by ENT - Discussed adding Biotene mouth wash or lozenges OTC to see if they help - Continue to push fluids   No follow-ups on file.     I discussed the assessment and treatment plan with the patient. The patient was provided an opportunity to ask questions and all were answered. The patient agreed with the plan and demonstrated an understanding of the instructions.   The patient was  advised to call back or seek an in-person evaluation if the symptoms worsen or if the condition fails to improve as anticipated.  I provided 14 minutes of face-to-face time during this encounter via MyChart Video enabled encounter.   [facility] [phone] (phone) [phone] (fax)  [facility]

## 2020-10-25 ENCOUNTER — Telehealth: Payer: PRIVATE HEALTH INSURANCE | Admitting: Nurse Practitioner

## 2020-10-25 DIAGNOSIS — U071 COVID-19: Secondary | ICD-10-CM

## 2020-10-25 NOTE — Progress Notes (Signed)
Virtual Visit Consent   Tara Rose, you are scheduled for a virtual visit with a [facility] provider today.     Just as with appointments in the office, your consent must be obtained to participate.  Your consent will be active for this visit and any virtual visit you may have with one of our providers in the next 365 days.     If you have a MyChart account, a copy of this consent can be sent to you electronically.  All virtual visits are billed to your insurance company just like a traditional visit in the office.    As this is a virtual visit, video technology does not allow for your provider to perform a traditional examination.  This may limit your provider's ability to fully assess your condition.  If your provider identifies any concerns that need to be evaluated in person or the need to arrange testing (such as labs, EKG, etc.), we will make arrangements to do so.     Although advances in technology are sophisticated, we cannot ensure that it will always work on either your end or our end.  If the connection with a video visit is poor, the visit may have to be switched to a telephone visit.  With either a video or telephone visit, we are not always able to ensure that we have a secure connection.     I need to obtain your verbal consent now.   Are you willing to proceed with your visit today?    Tara Rose has provided verbal consent on 10/25/2020 for a virtual visit (video or telephone).   Tara Simas, FNP   Date: 10/25/2020 2:38 PM   Virtual Visit via Video Note   I, Tara Rose, connected with  Tara Rose  ([id number], 12-23-1990) on 10/25/20 at  2:45 PM EDT by a video-enabled telemedicine application and verified that I am speaking with the correct person using two identifiers.  Location: Patient: Virtual Visit Location Patient: Home Provider: Virtual Visit Location Provider: Home Office   I discussed the limitations of evaluation and management by telemedicine  and the availability of in person appointments. The patient expressed understanding and agreed to proceed.    History of Present Illness: Tara Rose is a 30 y.o. who identifies as a female who was assigned female at birth, and is being seen today after testing positive for COVID-19 today with a home test.  So far she has had symptoms of a headache and a sore throat that started yesterday, today she is having more nasal congestion and some body aches and fatigue.   She has had COVID in the past (2 years ago) no hospitalization.   She has not been vaccinated for COVID-19 Denies a history of asthma.   She has been using ibuprofen for symptom relief.   She does smoke cigarettes   HPI:  +nasal congestion +fatigue +sore throat  -fever Problems:  Patient Active Problem List   Diagnosis Date Noted   Irregular menses 11/06/2018   Desire for pregnancy 11/06/2018    Allergies: No Known Allergies  Current Outpatient Medications  Medication Instructions   chlorhexidine (PERIDEX) 0.12 % solution SWISH AND SPIT 15 ML PO BID FOR 30 SECONDS     Observations/Objective: Patient is well-developed, well-nourished in no acute distress.  Resting comfortably at home.  Head is normocephalic, atraumatic.  No labored breathing.  Speech is clear and coherent with logical content.  Patient is alert and oriented at  baseline.    Assessment and Plan: 1. COVID-19 Discussed management options with patient.  She is not currently under the care of a PCP and has not had recent labwork done so she is not currently a candidate for Plaxovid.  She is actively trying to get pregnant and does not wish to stop that process not a candidate for MOLNUPIRAVIR.   Discussed with patient over the counter medications to help with symptom support. She  may continue ibuprofen as needed for headaches body aches and fever as needed.    She can use an over the counter decongestant like Dayquil/Nyquil for nasal  congestion relief.   She will follow up if symptoms persist or worsen.  Will seek immediate medical attention for any acutely worsening symptoms as discussed.   If she would like to be a candidate for Paxlovid she will visit an UC for labwork.  Follow Up Instructions: I discussed the assessment and treatment plan with the patient. The patient was provided an opportunity to ask questions and all were answered. The patient agreed with the plan and demonstrated an understanding of the instructions.  A copy of instructions were sent to the patient via MyChart.  The patient was advised to call back or seek an in-person evaluation if the symptoms worsen or if the condition fails to improve as anticipated.  Time:  I spent 15 minutes with the patient via telehealth technology discussing the above problems/concerns.    Tara Simas, FNP

## 2020-10-28 ENCOUNTER — Telehealth: Payer: PRIVATE HEALTH INSURANCE | Admitting: Physician Assistant

## 2020-10-28 DIAGNOSIS — R059 Cough, unspecified: Secondary | ICD-10-CM

## 2020-10-28 NOTE — Progress Notes (Signed)
Patient has questions about returning to work after covid 19 diagnosis. No evisit required.

## 2020-11-03 ENCOUNTER — Telehealth: Payer: PRIVATE HEALTH INSURANCE | Admitting: Physician Assistant

## 2020-11-03 DIAGNOSIS — B9689 Other specified bacterial agents as the cause of diseases classified elsewhere: Secondary | ICD-10-CM | POA: Diagnosis not present

## 2020-11-03 DIAGNOSIS — J019 Acute sinusitis, unspecified: Secondary | ICD-10-CM

## 2020-11-03 MED ORDER — AMOXICILLIN-POT CLAVULANATE 875-125 MG PO TABS: 1.0000 | ORAL_TABLET | Freq: Two times a day (BID) | ORAL | 0 refills | Status: DC

## 2020-11-03 MED ORDER — AMOXICILLIN 875 MG PO TABS: 875.0000 mg | ORAL_TABLET | Freq: Two times a day (BID) | ORAL | 0 refills | Status: AC

## 2020-11-03 NOTE — Addendum Note (Signed)
Addended by: Waldon Merl on: 11/03/2020 05:29 PM   Modules accepted: Orders

## 2020-11-03 NOTE — Progress Notes (Signed)
I have spent 5 minutes in review of e-visit questionnaire, review and updating patient chart, medical decision making and response to patient.   Issiac Jamar Cody Nahia Nissan, PA-C    

## 2020-11-03 NOTE — Progress Notes (Signed)

## 2020-12-10 ENCOUNTER — Telehealth: Payer: PRIVATE HEALTH INSURANCE | Admitting: Physician Assistant

## 2020-12-10 DIAGNOSIS — H9311 Tinnitus, right ear: Secondary | ICD-10-CM

## 2020-12-10 MED ORDER — FLUTICASONE PROPIONATE 50 MCG/ACT NA SUSP: 2.0000 | Freq: Every day | NASAL | 0 refills | Status: DC

## 2020-12-10 NOTE — Progress Notes (Signed)
E-Visit for Sinus Problems  We are sorry that you are not feeling well.  Here is how we plan to help!  Based on what you have shared with me it looks like you have Tinnitus. You may use an oral decongestant such as Mucinex D or if you have glaucoma or high blood pressure use plain Mucinex. Saline nasal spray help and can safely be used as often as needed for congestion, I have prescribed: Fluticasone nasal spray two sprays in each nostril once a day  This can be secondary to eustachian tube dysfunction. This can be treated with Flonase 1 spray in each nostril twice daily and sudafed 10mg  TID.   If this does not help symptoms in 7-10 days please be seen in person.  Sinus infections are not as easily transmitted as other respiratory infection, however we still recommend that you avoid close contact with loved ones, especially the very young and elderly.  Remember to wash your hands thoroughly throughout the day as this is the number one way to prevent the spread of infection!  Home Care: Only take medications as instructed by your medical team. Do not take these medications with alcohol. A steam or ultrasonic humidifier can help congestion.  You can place a towel over your head and breathe in the steam from hot water coming from a faucet. Avoid close contacts especially the very young and the elderly. Cover your mouth when you cough or sneeze. Always remember to wash your hands.  Get Help Right Away If: You develop worsening fever or sinus pain. You develop a severe head ache or visual changes. Your symptoms persist after you have completed your treatment plan.  Make sure you Understand these instructions. Will watch your condition. Will get help right away if you are not doing well or get worse.   Thank you for choosing an e-visit.  Your e-visit answers were reviewed by a board certified advanced clinical practitioner to complete your personal care plan. Depending upon the condition,  your plan could have included both over the counter or prescription medications.  Please review your pharmacy choice. Make sure the pharmacy is open so you can pick up prescription now. If there is a problem, you may contact your provider through 9-10 and have the prescription routed to another pharmacy.  Your safety is important to [employer]. If you have drug allergies check your prescription carefully.   For the next 24 hours you can use MyChart to ask questions about today's visit, request a non-urgent call back, or ask for a work or school excuse. You will get an email in the next two days asking about your experience. I hope that your e-visit has been valuable and will speed your recovery.  I provided 7 minutes of non face-to-face time during this encounter for chart review and documentation.

## 2022-03-02 ENCOUNTER — Telehealth: Payer: Self-pay

## 2022-03-24 ENCOUNTER — Ambulatory Visit
Admission: EM | Admit: 2022-03-24 | Discharge: 2022-03-24 | Disposition: A | Discharge status: Home / Self Care | Payer: PRIVATE HEALTH INSURANCE | Attending: Urgent Care | Admitting: Urgent Care

## 2022-03-24 DIAGNOSIS — R81 Glycosuria: Secondary | ICD-10-CM

## 2022-03-24 DIAGNOSIS — R42 Dizziness and giddiness: Secondary | ICD-10-CM

## 2022-03-24 DIAGNOSIS — Z3202 Encounter for pregnancy test, result negative: Secondary | ICD-10-CM

## 2022-03-24 LAB — POCT URINALYSIS DIP (MANUAL ENTRY)
Bilirubin, UA: NEGATIVE
Glucose, UA: 500 mg/dL — AB
Ketones, POC UA: NEGATIVE mg/dL
Leukocytes, UA: NEGATIVE
Nitrite, UA: NEGATIVE
Protein Ur, POC: NEGATIVE mg/dL
Spec Grav, UA: 1.025 (ref 1.010–1.025)
Urobilinogen, UA: 1 E.U./dL
pH, UA: 6.5 (ref 5.0–8.0)

## 2022-03-24 LAB — POCT URINE PREGNANCY: Preg Test, Ur: NEGATIVE

## 2022-03-24 NOTE — ED Provider Notes (Signed)
Tara Rose    CSN: [id number] Arrival date & time: 03/24/22  1745      History   Chief Complaint Chief Complaint  Patient presents with   Dizziness   Nausea    HPI Tara Rose is a 31 y.o. female.    Dizziness   Presents to urgent care with complaint of dizziness and nausea starting yesterday.  She requests pregnancy testing.  Patient endorses having UTIs without typical symptoms.  She states she does not get dysuria as other people would but gets dizziness instead.    History reviewed. No pertinent past medical history.  Patient Active Problem List   Diagnosis Date Noted   Irregular menses 11/06/2018   Desire for pregnancy 11/06/2018    Past Surgical History:  Procedure Laterality Date   WISDOM TOOTH EXTRACTION      OB History     Gravida  0   Para  0   Term  0   Preterm  0   AB  0   Living  0      SAB  0   IAB  0   Ectopic  0   Multiple  0   Live Births  0            Home Medications    Prior to Admission medications   Medication Sig Start Date End Date Taking? Authorizing Provider  chlorhexidine (PERIDEX) 0.12 % solution SWISH AND SPIT 15 ML PO BID FOR 30 SECONDS 11/02/18   [provider]  fluticasone (FLONASE) 50 MCG/ACT nasal spray Place 2 sprays into both nostrils daily. 12/10/20   Margaretann Loveless, PA-C    Family History Family History  Problem Relation Age of Onset   Seizures Brother    Cancer Maternal Grandmother    Cancer Maternal Grandfather     Social History Social History   Tobacco Use   Smoking status: Former   Smokeless tobacco: Never  Building services engineer Use: Never used  Substance Use Topics   Alcohol use: Never   Drug use: Never     Allergies   Lactose intolerance (gi)   Review of Systems Review of Systems  Neurological:  Positive for dizziness.     Physical Exam Triage Vital Signs ED Triage Vitals  Enc Vitals Group     BP 03/24/22 1812 125/69     Pulse  Rate 03/24/22 1812 (!) 103     Resp 03/24/22 1812 18     Temp 03/24/22 1812 98.8 F (37.1 C)     Temp Source 03/24/22 1812 Oral     SpO2 03/24/22 1812 98 %     Weight --      Height --      Head Circumference --      Peak Flow --      Pain Score 03/24/22 1810 0     Pain Loc --      Pain Edu? --      Excl. in GC? --    No data found.  Updated Vital Signs BP 125/69 (BP Location: Left Arm)   Pulse (!) 103   Temp 98.8 F (37.1 C) (Oral)   Resp 18   LMP  (LMP Unknown)   SpO2 98%   Visual Acuity Right Eye Distance:   Left Eye Distance:   Bilateral Distance:    Right Eye Near:   Left Eye Near:    Bilateral Near:     Physical Exam Vitals reviewed.  Constitutional:      Appearance: Normal appearance. She is not ill-appearing.  Skin:    General: Skin is warm and dry.  Neurological:     General: No focal deficit present.     Mental Status: She is alert and oriented to person, place, and time.  Psychiatric:        Mood and Affect: Mood normal.        Behavior: Behavior normal.      UC Treatments / Results  Labs (all labs ordered are listed, but only abnormal results are displayed) Labs Reviewed  POCT URINE PREGNANCY    EKG   Radiology No results found.  Procedures Procedures (including critical care time)  Medications Ordered in UC Medications - No data to display  Initial Impression / Assessment and Plan / UC Course  I have reviewed the triage vital signs and the nursing notes.  Pertinent labs & imaging results that were available during my care of the patient were reviewed by me and considered in my medical decision making (see chart for details).   Patient is afebrile here without recent antipyretics. Satting well on room air. Overall is well appearing, well hydrated, without respiratory distress.   Pregnancy negative.  UA with significant glucosuria.  Patient denies personal history of DM 2, or family history.  Chart indicates last labs 2020 with  elevated glucose level.  No follow-up.  (She was seen at that time for dizziness as well).  Recommended patient establish with primary care to be evaluated for possible DM2 diagnosis.  Patient states she has no insurance.  Suggested she contact [facility] or [facility] and inquire about their financial assistance program.    Final Clinical Impressions(s) / UC Diagnoses   Final diagnoses:  None   Discharge Instructions   None    ED Prescriptions   None    PDMP not reviewed this encounter.   Charma Igo, [place] 03/24/22 1928

## 2022-03-24 NOTE — ED Triage Notes (Signed)
Pt. Presents to UC w/ c/o dizziness and nausea since yesterday. Pt. Also is requesting pregnancy testing.

## 2022-03-24 NOTE — Discharge Instructions (Signed)
Please establish care with a primary care provider who can evaluate you for possible diagnosis of diabetes mellitus type 2.

## 2022-03-28 ENCOUNTER — Telehealth: Payer: Self-pay | Admitting: Physician Assistant

## 2022-03-28 DIAGNOSIS — R7309 Other abnormal glucose: Secondary | ICD-10-CM

## 2022-03-28 NOTE — Patient Instructions (Signed)
Tara Rose, thank you for joining Tara Loveless, PA-C for today's virtual visit.  While this provider is not your primary care provider (PCP), if your PCP is located in our provider database this encounter information will be shared with them immediately following your visit.   A [facility] MyChart account gives you access to today's visit and all your visits, tests, and labs performed at [facility] " click here if you don't have a [facility] MyChart account or go to [email or web address]  Consent: (Patient) Tara Rose provided verbal consent for this virtual visit at the beginning of the encounter.  Current Medications:  Current Outpatient Medications:    chlorhexidine (PERIDEX) 0.12 % solution, SWISH AND SPIT 15 ML PO BID FOR 30 SECONDS, Disp: , Rfl:    fluticasone (FLONASE) 50 MCG/ACT nasal spray, Place 2 sprays into both nostrils daily., Disp: 16 g, Rfl: 0   Medications ordered in this encounter:  No orders of the defined types were placed in this encounter.    *If you need refills on other medications prior to your next appointment, please contact your pharmacy*  Follow-Up: Call back or seek an in-person evaluation if the symptoms worsen or if the condition fails to improve as anticipated.  [facility] [phone]  Other Instructions Diabetes Mellitus Basics  Diabetes mellitus, or diabetes, is a long-term (chronic) disease. It occurs when the body does not properly use sugar (glucose) that is released from food after you eat. Diabetes mellitus may be caused by one or both of these problems: Your pancreas does not make enough of a hormone called insulin. Your body does not react in a normal way to the insulin that it makes. Insulin lets glucose enter cells in your body. This gives you energy. If you have diabetes, glucose cannot get into cells. This causes high blood glucose (hyperglycemia). How to treat and manage diabetes You  may need to take insulin or other diabetes medicines daily to keep your glucose in balance. If you are prescribed insulin, you will learn how to give yourself insulin by injection. You may need to adjust the amount of insulin you take based on the foods that you eat. You will need to check your blood glucose levels using a glucose monitor as told by your health care provider. The readings can help determine if you have low or high blood glucose. Generally, you should have these blood glucose levels: Before meals (preprandial): 80-130 mg/dL (0.7-3.7 mmol/L). After meals (postprandial): below 180 mg/dL (10 mmol/L). Hemoglobin A1c (HbA1c) level: less than 7%. Your health care provider will set treatment goals for you. Keep all follow-up visits. This is important.  Low blood glucose (hypoglycemia) is when glucose is at or below 70 mg/dL (3.9 mmol/L). Symptoms may include: Feeling: Hungry. Sweaty and clammy. Irritable or easily upset. Dizzy. Sleepy. Having: A fast heartbeat. A headache. A change in your vision. Numbness around the mouth, lips, or tongue. Having trouble with: Moving (coordination). Sleeping. Treating low blood glucose To treat low blood glucose, eat or drink something containing sugar right away. If you can think clearly and swallow safely, follow the 15:15 rule: Take 15 grams of a fast-acting carb (carbohydrate), as told by your health care provider. Some fast-acting carbs are: Glucose tablets: take 3-4 tablets. Hard candy: eat 3-5 pieces. Fruit juice: drink 4 oz (120 mL). Regular (not diet) soda: drink 4-6 oz (120-180 mL). Honey or sugar: eat 1 Tbsp (15 mL). Check your blood glucose levels  15 minutes after you take the carb. If your glucose is still at or below 70 mg/dL (3.9 mmol/L), take 15 grams of a carb again. If your glucose does not go above 70 mg/dL (3.9 mmol/L) after 3 tries, get help right away. After your glucose goes back to normal, eat a meal or a snack  within 1 hour. Treating very low blood glucose If your glucose is at or below 54 mg/dL (3 mmol/L), you have very low blood glucose (severe hypoglycemia). This is an emergency. Do not wait to see if the symptoms will go away. Get medical help right away. Call your local emergency services (911 in the [place] Do not drive yourself to the hospital. Questions to ask your health care provider Should I talk with a diabetes educator? What equipment will I need to care for myself at home? What diabetes medicines do I need? When should I take them? How often do I need to check my blood glucose levels? What number can I call if I have questions? When is my follow-up visit? Where can I find a support group for people with diabetes? Where to find more information American Diabetes Association: www.diabetes.org Association of Diabetes Care and Education Specialists: www.diabeteseducator.org Contact a health care provider if: Your blood glucose is at or above 240 mg/dL (62.9 mmol/L) for 2 days in a row. You have been sick or have had a fever for 2 days or more, and you are not getting better. You have any of these problems for more than 6 hours: You cannot eat or drink. You feel nauseous. You vomit. You have diarrhea. Get help right away if: Your blood glucose is lower than 54 mg/dL (3 mmol/L). You get confused. You have trouble thinking clearly. You have trouble breathing. These symptoms may represent a serious problem that is an emergency. Do not wait to see if the symptoms will go away. Get medical help right away. Call your local emergency services (911 in the [place] Do not drive yourself to the hospital. Summary Diabetes mellitus is a chronic disease that occurs when the body does not properly use sugar (glucose) that is released from food after you eat. Take insulin and diabetes medicines as told. Check your blood glucose every day, as often as told. Keep all follow-up visits. This is  important. This information is not intended to replace advice given to you by your health care provider. Make sure you discuss any questions you have with your health care provider. Document Revised: 08/05/2019 Document Reviewed: 08/05/2019 Elsevier Patient Education  2023 Elsevier Inc.  Carbohydrate Counting for Diabetes Mellitus, Adult Carbohydrate counting is a method of keeping track of how many carbohydrates you eat. Eating carbohydrates increases the amount of sugar (glucose) in the blood. Counting how many carbohydrates you eat improves how well you manage your blood glucose. This, in turn, helps you manage your diabetes. Carbohydrates are measured in grams (g) per serving. It is important to know how many carbohydrates (in grams or by serving size) you can have in each meal. This is different for every person. A dietitian can help you make a meal plan and calculate how many carbohydrates you should have at each meal and snack. What foods contain carbohydrates? Carbohydrates are found in the following foods: Grains, such as breads and cereals. Dried beans and soy products. Starchy vegetables, such as potatoes, peas, and corn. Fruit and fruit juices. Milk and yogurt. Sweets and snack foods, such as cake, cookies, candy, chips, and soft drinks.  How do I count carbohydrates in foods? There are two ways to count carbohydrates in food. You can read food labels or learn standard serving sizes of foods. You can use either of these methods or a combination of both. Using the Nutrition Facts label The Nutrition Facts list is included on the labels of almost all packaged foods and beverages in the [place]. It includes: The serving size. Information about nutrients in each serving, including the grams of carbohydrate per serving. To use the Nutrition Facts, decide how many servings you will have. Then, multiply the number of servings by the number of carbohydrates per serving. The resulting  number is the total grams of carbohydrates that you will be having. Learning the standard serving sizes of foods When you eat carbohydrate foods that are not packaged or do not include Nutrition Facts on the label, you need to measure the servings in order to count the grams of carbohydrates. Measure the foods that you will eat with a food scale or measuring cup, if needed. Decide how many standard-size servings you will eat. Multiply the number of servings by 15. For foods that contain carbohydrates, one serving equals 15 g of carbohydrates. For example, if you eat 2 cups or 10 oz (300 g) of strawberries, you will have eaten 2 servings and 30 g of carbohydrates (2 servings x 15 g = 30 g). For foods that have more than one food mixed, such as soups and casseroles, you must count the carbohydrates in each food that is included. The following list contains standard serving sizes of common carbohydrate-rich foods. Each of these servings has about 15 g of carbohydrates: 1 slice of bread. 1 six-inch (15 cm) tortilla. ? cup or 2 oz (53 g) cooked rice or pasta.  cup or 3 oz (85 g) cooked or canned, drained and rinsed beans or lentils.  cup or 3 oz (85 g) starchy vegetable, such as peas, corn, or squash.  cup or 4 oz (120 g) hot cereal.  cup or 3 oz (85 g) boiled or mashed potatoes, or  or 3 oz (85 g) of a large baked potato.  cup or 4 fl oz (118 mL) fruit juice. 1 cup or 8 fl oz (237 mL) milk. 1 small or 4 oz (106 g) apple.  or 2 oz (63 g) of a medium banana. 1 cup or 5 oz (150 g) strawberries. 3 cups or 1 oz (28.3 g) popped popcorn. What is an example of carbohydrate counting? To calculate the grams of carbohydrates in this sample meal, follow the steps shown below. Sample meal 3 oz (85 g) chicken breast. ? cup or 4 oz (106 g) brown rice.  cup or 3 oz (85 g) corn. 1 cup or 8 fl oz (237 mL) milk. 1 cup or 5 oz (150 g) strawberries with sugar-free whipped topping. Carbohydrate  calculation Identify the foods that contain carbohydrates: Rice. Corn. Milk. Strawberries. Calculate how many servings you have of each food: 2 servings rice. 1 serving corn. 1 serving milk. 1 serving strawberries. Multiply each number of servings by 15 g: 2 servings rice x 15 g = 30 g. 1 serving corn x 15 g = 15 g. 1 serving milk x 15 g = 15 g. 1 serving strawberries x 15 g = 15 g. Add together all of the amounts to find the total grams of carbohydrates eaten: 30 g + 15 g + 15 g + 15 g = 75 g of carbohydrates total. What are  tips for following this plan? Shopping Develop a meal plan and then make a shopping list. Buy fresh and frozen vegetables, fresh and frozen fruit, dairy, eggs, beans, lentils, and whole grains. Look at food labels. Choose foods that have more fiber and less sugar. Avoid processed foods and foods with added sugars. Meal planning Aim to have the same number of grams of carbohydrates at each meal and for each snack time. Plan to have regular, balanced meals and snacks. Where to find more information American Diabetes Association: diabetes.org Centers for Disease Control and Prevention: TonerPromos.nocdc.gov Academy of Nutrition and Dietetics: eatright.org Association of Diabetes Care & Education Specialists: diabeteseducator.org Summary Carbohydrate counting is a method of keeping track of how many carbohydrates you eat. Eating carbohydrates increases the amount of sugar (glucose) in your blood. Counting how many carbohydrates you eat improves how well you manage your blood glucose. This helps you manage your diabetes. A dietitian can help you make a meal plan and calculate how many carbohydrates you should have at each meal and snack. This information is not intended to replace advice given to you by your health care provider. Make sure you discuss any questions you have with your health care provider. Document Revised: 11/05/2019 Document Reviewed: 11/05/2019 Elsevier  Patient Education  2023 Elsevier Inc.   If you have been instructed to have an in-person evaluation today at a local Urgent Care facility, please use the link below. It will take you to a list of all of our available [facility], including address, phone number and hours of operation. Please do not delay care.  [facility]  If you or a family member do not have a primary care provider, use the link below to schedule a visit and establish care. When you choose a [facility] primary care physician or advanced practice provider, you gain a long-term partner in health. Find a Primary Care Provider  Learn more about [facility]'s in-office and virtual care options: [place] - Get Care Now

## 2022-03-28 NOTE — Progress Notes (Signed)
Virtual Visit Consent   Tara Rose, you are scheduled for a virtual visit with a [facility] provider today. Just as with appointments in the office, your consent must be obtained to participate. Your consent will be active for this visit and any virtual visit you may have with one of our providers in the next 365 days. If you have a MyChart account, a copy of this consent can be sent to you electronically.  As this is a virtual visit, video technology does not allow for your provider to perform a traditional examination. This may limit your provider's ability to fully assess your condition. If your provider identifies any concerns that need to be evaluated in person or the need to arrange testing (such as labs, EKG, etc.), we will make arrangements to do so. Although advances in technology are sophisticated, we cannot ensure that it will always work on either your end or our end. If the connection with a video visit is poor, the visit may have to be switched to a telephone visit. With either a video or telephone visit, we are not always able to ensure that we have a secure connection.  By engaging in this virtual visit, you consent to the provision of healthcare and authorize for your insurance to be billed (if applicable) for the services provided during this visit. Depending on your insurance coverage, you may receive a charge related to this service.  I need to obtain your verbal consent now. Are you willing to proceed with your visit today? Tara Rose has provided verbal consent on 03/28/2022 for a virtual visit (video or telephone). Tara Loveless, PA-C  Date: 03/28/2022 10:52 AM  Virtual Visit via Video Note   I, Tara Rose, connected with  Tara Rose  ([id number], 1991-04-08) on 03/28/22 at  8:45 AM EST by a video-enabled telemedicine application and verified that I am speaking with the correct person using two identifiers.  Location: Patient: Virtual Visit  Location Patient: Home Provider: Virtual Visit Location Provider: Home Office   I discussed the limitations of evaluation and management by telemedicine and the availability of in person appointments. The patient expressed understanding and agreed to proceed.    History of Present Illness: Tara Rose is a 31 y.o. who identifies as a female who was assigned female at birth, and is being seen today for blood sugar concerns.  Blood sugar today is 206 (postprandial; 1 hr after eating), lowest has been 111 since Friday. At 111 she did have dizziness and shakiness. In 2020 a random glucose was 171.   Went to UC on Friday, 03/24/22, for dizziness and possible UTI. Had UA that was positive for +500 glucosuria. Rest of UA and urine pregnancy were unremarkable. No other testing was completed.   No A1c on record. No family history of diabetes reported.  Did start trying to eat a Keto diet and has started to exercise some.   Concerns over what are normal readings and when to be concerned/seek medical attention.    Problems:  Patient Active Problem List   Diagnosis Date Noted   Irregular menses 11/06/2018   Desire for pregnancy 11/06/2018    Allergies:  Allergies  Allergen Reactions   Lactose Intolerance (Gi) Diarrhea   Medications:  Current Outpatient Medications:    chlorhexidine (PERIDEX) 0.12 % solution, SWISH AND SPIT 15 ML PO BID FOR 30 SECONDS, Disp: , Rfl:    fluticasone (FLONASE) 50 MCG/ACT nasal spray, Place 2 sprays into  both nostrils daily., Disp: 16 g, Rfl: 0  Observations/Objective: Patient is well-developed, well-nourished in no acute distress.  Resting comfortably at home.  Head is normocephalic, atraumatic.  No labored breathing.  Speech is clear and coherent with logical content.  Patient is alert and oriented at baseline.    Assessment and Plan: 1. Elevated glucose level  - Suspect patient has had long standing diabetes without a diagnosis, possibly up to at  least 3 years.  - Discussed normal blood sugar readings for a diabetic could be 120-140s - Aim to stay below 150s - Alarm numbers are 400s, seek immediate care at ER if that elevated - Continue low carb dieting and exericse - Advised to seek resources online like diabetic education books and diabetic cook books to help with carb counting and learning the appropriate way to eat for diabetics - Discussed small frequent meals vs intermittent fasting, both can be beneficial with glycemic index control (small frequent meals need to be 200 calories or less 6 to 7 times daily; advised most can overeat using this method so have to be careful) - Discussed going ahead and calling for PCP appt, new insurance starts in January for her to establish - Seek in person evaluation if she continues to be symptomatic or if BS increases above 300-400   Follow Up Instructions: I discussed the assessment and treatment plan with the patient. The patient was provided an opportunity to ask questions and all were answered. The patient agreed with the plan and demonstrated an understanding of the instructions.  A copy of instructions were sent to the patient via MyChart unless otherwise noted below.    The patient was advised to call back or seek an in-person evaluation if the symptoms worsen or if the condition fails to improve as anticipated.  Time:  I spent 20 minutes with the patient via telehealth technology discussing the above problems/concerns.    Tara Loveless, PA-C

## 2022-04-18 ENCOUNTER — Ambulatory Visit: Payer: Self-pay | Admitting: Physician Assistant

## 2022-04-18 DIAGNOSIS — F410 Panic disorder [episodic paroxysmal anxiety] without agoraphobia: Secondary | ICD-10-CM | POA: Diagnosis not present

## 2022-04-18 DIAGNOSIS — R69 Illness, unspecified: Secondary | ICD-10-CM | POA: Diagnosis not present

## 2022-04-18 NOTE — Progress Notes (Unsigned)
   LMP  (LMP Unknown)    Subjective:    Patient ID: Tara Rose, female    DOB: [birth year], 32 y.o.   MRN: [id number]  HPI: Tara Rose is a 32 y.o. female  No chief complaint on file.  Establish care: her last physical was ***.  Medical history includes ***.  Family history includes ***.  Pap ***.  Elevated blood sugar reading:   Relevant past medical, surgical, family and social history reviewed and updated as indicated. Interim medical history since our last visit reviewed. Allergies and medications reviewed and updated.  Review of Systems  Constitutional: Negative for fever or weight change.  Respiratory: Negative for cough and shortness of breath.   Cardiovascular: Negative for chest pain or palpitations.  Gastrointestinal: Negative for abdominal pain, no bowel changes.  Musculoskeletal: Negative for gait problem or joint swelling.  Skin: Negative for rash.  Neurological: Negative for dizziness or headache.  No other specific complaints in a complete review of systems (except as listed in HPI above).      Objective:    LMP  (LMP Unknown)   Wt Readings from Last 3 Encounters:  03/09/19 184 lb (83.5 kg)  11/27/18 192 lb 6 oz (87.3 kg)    Physical Exam  Constitutional: Patient appears well-developed and well-nourished. Obese *** No distress.  HEENT: head atraumatic, normocephalic, pupils equal and reactive to light, ears ***, neck supple, throat within normal limits Cardiovascular: Normal rate, regular rhythm and normal heart sounds.  No murmur heard. No BLE edema. Pulmonary/Chest: Effort normal and breath sounds normal. No respiratory distress. Abdominal: Soft.  There is no tenderness. Psychiatric: Patient has a normal mood and affect. behavior is normal. Judgment and thought content normal.  Results for orders placed or performed during the hospital encounter of 03/24/22  POCT urine pregnancy  Result Value Ref Range   Preg Test, Ur Negative Negative  POCT  urinalysis dipstick  Result Value Ref Range   Color, UA yellow yellow   Clarity, UA clear clear   Glucose, UA =500 (A) negative mg/dL   Bilirubin, UA negative negative   Ketones, POC UA negative negative mg/dL   Spec Grav, UA 1.025 1.010 - 1.025   Blood, UA moderate (A) negative   pH, UA 6.5 5.0 - 8.0   Protein Ur, POC negative negative mg/dL   Urobilinogen, UA 1.0 0.2 or 1.0 E.U./dL   Nitrite, UA Negative Negative   Leukocytes, UA Negative Negative      Assessment & Plan:   Problem List Items Addressed This Visit   None    Follow up plan: No follow-ups on file.

## 2022-04-19 ENCOUNTER — Ambulatory Visit: Payer: 59 | Admitting: Nurse Practitioner

## 2022-04-19 ENCOUNTER — Other Ambulatory Visit: Payer: Self-pay

## 2022-04-19 ENCOUNTER — Encounter: Payer: Self-pay | Admitting: Nurse Practitioner

## 2022-04-19 VITALS — BP 120/76 | HR 100 | Temp 98.4°F | Resp 18 | Ht 61.0 in | Wt 178.1 lb

## 2022-04-19 DIAGNOSIS — Z7689 Persons encountering health services in other specified circumstances: Secondary | ICD-10-CM

## 2022-04-19 DIAGNOSIS — Z13220 Encounter for screening for lipoid disorders: Secondary | ICD-10-CM | POA: Diagnosis not present

## 2022-04-19 DIAGNOSIS — Z1159 Encounter for screening for other viral diseases: Secondary | ICD-10-CM | POA: Diagnosis not present

## 2022-04-19 DIAGNOSIS — Z131 Encounter for screening for diabetes mellitus: Secondary | ICD-10-CM | POA: Diagnosis not present

## 2022-04-19 DIAGNOSIS — Z130 Encounter for screening for diseases of the blood and blood-forming organs and certain disorders involving the immune mechanism: Secondary | ICD-10-CM | POA: Diagnosis not present

## 2022-04-19 DIAGNOSIS — Z114 Encounter for screening for human immunodeficiency virus [HIV]: Secondary | ICD-10-CM | POA: Diagnosis not present

## 2022-04-19 DIAGNOSIS — E6609 Other obesity due to excess calories: Secondary | ICD-10-CM | POA: Diagnosis not present

## 2022-04-19 DIAGNOSIS — R739 Hyperglycemia, unspecified: Secondary | ICD-10-CM | POA: Diagnosis not present

## 2022-04-19 DIAGNOSIS — N926 Irregular menstruation, unspecified: Secondary | ICD-10-CM | POA: Diagnosis not present

## 2022-04-19 DIAGNOSIS — Z6833 Body mass index (BMI) 33.0-33.9, adult: Secondary | ICD-10-CM

## 2022-04-19 DIAGNOSIS — R69 Illness, unspecified: Secondary | ICD-10-CM | POA: Diagnosis not present

## 2022-04-19 DIAGNOSIS — E66811 Obesity, class 1: Secondary | ICD-10-CM

## 2022-04-19 NOTE — Assessment & Plan Note (Signed)
continue eating healthy and exercising

## 2022-04-19 NOTE — Assessment & Plan Note (Signed)
Work up completed by GYN,  patient did not want to start birth control

## 2022-04-20 ENCOUNTER — Other Ambulatory Visit: Payer: Self-pay

## 2022-04-20 ENCOUNTER — Encounter: Payer: Self-pay | Admitting: Nurse Practitioner

## 2022-04-20 ENCOUNTER — Ambulatory Visit (INDEPENDENT_AMBULATORY_CARE_PROVIDER_SITE_OTHER): Payer: PRIVATE HEALTH INSURANCE | Admitting: Nurse Practitioner

## 2022-04-20 VITALS — BP 120/82 | HR 100 | Temp 98.2°F | Resp 16 | Ht 61.0 in | Wt 178.0 lb

## 2022-04-20 DIAGNOSIS — E119 Type 2 diabetes mellitus without complications: Secondary | ICD-10-CM | POA: Diagnosis not present

## 2022-04-20 DIAGNOSIS — E1165 Type 2 diabetes mellitus with hyperglycemia: Secondary | ICD-10-CM | POA: Insufficient documentation

## 2022-04-20 LAB — CBC WITH DIFFERENTIAL/PLATELET
Absolute Monocytes: 775 cells/uL (ref 200–950)
Basophils Absolute: 51 cells/uL (ref 0–200)
Basophils Relative: 0.4 %
Eosinophils Absolute: 165 cells/uL (ref 15–500)
Eosinophils Relative: 1.3 %
HCT: 46.2 % — ABNORMAL HIGH (ref 35.0–45.0)
Hemoglobin: 15.7 g/dL — ABNORMAL HIGH (ref 11.7–15.5)
Lymphs Abs: 3696 cells/uL (ref 850–3900)
MCH: 29.1 pg (ref 27.0–33.0)
MCHC: 34 g/dL (ref 32.0–36.0)
MCV: 85.6 fL (ref 80.0–100.0)
MPV: 10.3 fL (ref 7.5–12.5)
Monocytes Relative: 6.1 %
Neutro Abs: 8014 cells/uL — ABNORMAL HIGH (ref 1500–7800)
Neutrophils Relative %: 63.1 %
Platelets: 345 10*3/uL (ref 140–400)
RBC: 5.4 10*6/uL — ABNORMAL HIGH (ref 3.80–5.10)
RDW: 13 % (ref 11.0–15.0)
Total Lymphocyte: 29.1 %
WBC: 12.7 10*3/uL — ABNORMAL HIGH (ref 3.8–10.8)

## 2022-04-20 LAB — HIV ANTIBODY (ROUTINE TESTING W REFLEX): HIV 1&2 Ab, 4th Generation: NONREACTIVE

## 2022-04-20 LAB — COMPLETE METABOLIC PANEL WITH GFR
AG Ratio: 1.4 (calc) (ref 1.0–2.5)
ALT: 18 U/L (ref 6–29)
AST: 13 U/L (ref 10–30)
Albumin: 4.6 g/dL (ref 3.6–5.1)
Alkaline phosphatase (APISO): 57 U/L (ref 31–125)
BUN: 13 mg/dL (ref 7–25)
CO2: 23 mmol/L (ref 20–32)
Calcium: 9.8 mg/dL (ref 8.6–10.2)
Chloride: 103 mmol/L (ref 98–110)
Creat: 0.54 mg/dL (ref 0.50–0.96)
Globulin: 3.4 g/dL (calc) (ref 1.9–3.7)
Glucose, Bld: 106 mg/dL — ABNORMAL HIGH (ref 65–99)
Potassium: 4.5 mmol/L (ref 3.5–5.3)
Sodium: 139 mmol/L (ref 135–146)
Total Bilirubin: 0.9 mg/dL (ref 0.2–1.2)
Total Protein: 8 g/dL (ref 6.1–8.1)
eGFR: 128 mL/min/{1.73_m2} (ref >=60)

## 2022-04-20 LAB — LIPID PANEL
Cholesterol: 248 mg/dL — ABNORMAL HIGH (ref <200)
HDL: 33 mg/dL — ABNORMAL LOW (ref >=50)
LDL Cholesterol (Calc): 193 mg/dL (calc) — ABNORMAL HIGH
Non-HDL Cholesterol (Calc): 215 mg/dL (calc) — ABNORMAL HIGH (ref <130)
Total CHOL/HDL Ratio: 7.5 (calc) — ABNORMAL HIGH (ref <5.0)
Triglycerides: 102 mg/dL (ref <150)

## 2022-04-20 LAB — HEMOGLOBIN A1C
Hgb A1c MFr Bld: 8.7 % of total Hgb — ABNORMAL HIGH (ref <5.7)
Mean Plasma Glucose: 203 mg/dL
eAG (mmol/L): 11.2 mmol/L

## 2022-04-20 LAB — HEPATITIS C ANTIBODY: Hepatitis C Ab: NONREACTIVE

## 2022-04-20 LAB — TSH: TSH: 1.84 mIU/L

## 2022-04-20 MED ORDER — METFORMIN HCL ER 500 MG PO TB24: 500.0000 mg | ORAL_TABLET | Freq: Every day | ORAL | 0 refills | Status: DC

## 2022-04-20 NOTE — Assessment & Plan Note (Signed)
Continue working on lifestyle modification. Start metformin 500 mg daily.  Follow up in 3 months.

## 2022-04-20 NOTE — Progress Notes (Signed)
BP 120/82   Pulse 100   Temp 98.2 F (36.8 C) (Oral)   Resp 16   Ht _0  (1.549 m)   Wt 178 lb (80.7 kg)   LMP 04/05/2022   SpO2 98%   BMI 33.63 kg/m    Subjective:    Patient ID: Tara Rose, female    DOB: [birth year], 32 y.o.   MRN: [id number]  HPI: Tara Rose is a 32 y.o. female  Chief Complaint  Patient presents with   Diabetes    New Onset   New onset diabetes:  patient establish care yesterday with me.  She had reported elevated blood sugar readings. Discussed getting labs including A1C.  Patient's A1C came back elevated at 8.7.  Patient here today to discuss new diagnosis and treatment options.  Patient has been working on diet and exercise.  Discussed disease process and health maintenance that would be required.  Discussed treatment options.  Will start metformin 500 mg daily.  Discussed side effects and answered patient concerns.  Due for foot and eye exam and microalbumin urine. Patient will follow up in three months.   Relevant past medical, surgical, family and social history reviewed and updated as indicated. Interim medical history since our last visit reviewed. Allergies and medications reviewed and updated.  Review of Systems  Constitutional: Negative for fever or weight change.  Respiratory: Negative for cough and shortness of breath.   Cardiovascular: Negative for chest pain or palpitations.  Gastrointestinal: Negative for abdominal pain, no bowel changes.  Musculoskeletal: Negative for gait problem or joint swelling.  Skin: Negative for rash.  Neurological: Negative for dizziness or headache.  No other specific complaints in a complete review of systems (except as listed in HPI above).      Objective:    BP 120/82   Pulse 100   Temp 98.2 F (36.8 C) (Oral)   Resp 16   Ht _1  (1.549 m)   Wt 178 lb (80.7 kg)   LMP 04/05/2022   SpO2 98%   BMI 33.63 kg/m   Wt Readings from Last 3 Encounters:  04/20/22 178 lb (80.7 kg)  04/19/22 178  lb 1.6 oz (80.8 kg)  03/09/19 184 lb (83.5 kg)    Physical Exam  Constitutional: Patient appears well-developed and well-nourished. Obese  No distress.  HEENT: head atraumatic, normocephalic, pupils equal and reactive to light, neck supple Cardiovascular: Normal rate, regular rhythm and normal heart sounds.  No murmur heard. No BLE edema. Pulmonary/Chest: Effort normal and breath sounds normal. No respiratory distress. Abdominal: Soft.  There is no tenderness. Psychiatric: Patient has a normal mood and affect. behavior is normal. Judgment and thought content normal.   Results for orders placed or performed in visit on 04/19/22  CBC with Differential/Platelet  Result Value Ref Range   WBC 12.7 (H) 3.8 - 10.8 Thousand/uL   RBC 5.40 (H) 3.80 - 5.10 Million/uL   Hemoglobin 15.7 (H) 11.7 - 15.5 g/dL   HCT 46.2 (H) 35.0 - 45.0 %   MCV 85.6 80.0 - 100.0 fL   MCH 29.1 27.0 - 33.0 pg   MCHC 34.0 32.0 - 36.0 g/dL   RDW 13.0 11.0 - 15.0 %   Platelets 345 140 - 400 Thousand/uL   MPV 10.3 7.5 - 12.5 fL   Neutro Abs 8,014 (H) 1,500 - 7,800 cells/uL   Lymphs Abs 3,696 850 - 3,900 cells/uL   Absolute Monocytes 775 200 - 950 cells/uL   Eosinophils Absolute 165  15 - 500 cells/uL   Basophils Absolute 51 0 - 200 cells/uL   Neutrophils Relative % 63.1 %   Total Lymphocyte 29.1 %   Monocytes Relative 6.1 %   Eosinophils Relative 1.3 %   Basophils Relative 0.4 %  COMPLETE METABOLIC PANEL WITH GFR  Result Value Ref Range   Glucose, Bld 106 (H) 65 - 99 mg/dL   BUN 13 7 - 25 mg/dL   Creat 0.54 0.50 - 0.96 mg/dL   eGFR 128 > OR = 60 mL/min/1.40m   BUN/Creatinine Ratio SEE NOTE: 6 - 22 (calc)   Sodium 139 135 - 146 mmol/L   Potassium 4.5 3.5 - 5.3 mmol/L   Chloride 103 98 - 110 mmol/L   CO2 23 20 - 32 mmol/L   Calcium 9.8 8.6 - 10.2 mg/dL   Total Protein 8.0 6.1 - 8.1 g/dL   Albumin 4.6 3.6 - 5.1 g/dL   Globulin 3.4 1.9 - 3.7 g/dL (calc)   AG Ratio 1.4 1.0 - 2.5 (calc)   Total Bilirubin 0.9  0.2 - 1.2 mg/dL   Alkaline phosphatase (APISO) 57 31 - 125 U/L   AST 13 10 - 30 U/L   ALT 18 6 - 29 U/L  Lipid panel  Result Value Ref Range   Cholesterol 248 (H) <200 mg/dL   HDL 33 (L) > OR = 50 mg/dL   Triglycerides 102 <150 mg/dL   LDL Cholesterol (Calc) 193 (H) mg/dL (calc)   Total CHOL/HDL Ratio 7.5 (H) <5.0 (calc)   Non-HDL Cholesterol (Calc) 215 (H) <130 mg/dL (calc)  Hemoglobin A1c  Result Value Ref Range   Hgb A1c MFr Bld 8.7 (H) <5.7 % of total Hgb   Mean Plasma Glucose 203 mg/dL   eAG (mmol/L) 11.2 mmol/L  TSH  Result Value Ref Range   TSH 1.84 mIU/L      Assessment & Plan:   Problem List Items Addressed This Visit       Endocrine   New onset type 2 diabetes mellitus [place] Primary    Continue working on lifestyle modification. Start metformin 500 mg daily.  Follow up in 3 months.       Relevant Medications   metFORMIN (GLUCOPHAGE-XR) 500 MG 24 hr tablet   Other Relevant Orders   HM Diabetes Foot Exam (Completed)   Microalbumin / creatinine urine ratio     Follow up plan: Return in about 3 months (around 07/20/2022) for follow up.

## 2022-04-21 LAB — MICROALBUMIN / CREATININE URINE RATIO
Creatinine, Urine: 113 mg/dL (ref 20–275)
Microalb Creat Ratio: 12 mcg/mg creat (ref <30)
Microalb, Ur: 1.4 mg/dL

## 2022-04-24 ENCOUNTER — Encounter: Payer: Self-pay | Admitting: Nurse Practitioner

## 2022-04-24 DIAGNOSIS — R69 Illness, unspecified: Secondary | ICD-10-CM | POA: Diagnosis not present

## 2022-04-28 ENCOUNTER — Other Ambulatory Visit: Payer: Self-pay | Admitting: Internal Medicine

## 2022-05-11 ENCOUNTER — Ambulatory Visit: Payer: PRIVATE HEALTH INSURANCE | Admitting: Nurse Practitioner

## 2022-05-22 ENCOUNTER — Ambulatory Visit: Payer: PRIVATE HEALTH INSURANCE | Admitting: Nurse Practitioner

## 2022-06-19 ENCOUNTER — Telehealth: Payer: Self-pay

## 2022-06-19 DIAGNOSIS — M79671 Pain in right foot: Secondary | ICD-10-CM | POA: Diagnosis not present

## 2022-07-12 ENCOUNTER — Other Ambulatory Visit: Payer: Self-pay | Admitting: Nurse Practitioner

## 2022-07-12 DIAGNOSIS — E119 Type 2 diabetes mellitus without complications: Secondary | ICD-10-CM

## 2022-07-12 NOTE — Telephone Encounter (Signed)
Requested Prescriptions  Pending Prescriptions Disp Refills   metFORMIN (GLUCOPHAGE-XR) 500 MG 24 hr tablet [Pharmacy Med Name: METFORMIN HCL ER 500 MG TABLET] 90 tablet 0    Sig: TAKE 1 TABLET BY MOUTH EVERY DAY WITH BREAKFAST     Endocrinology:  Diabetes - Biguanides Failed - 07/12/2022  2:23 AM      Failed - HBA1C is between 0 and 7.9 and within 180 days    Hgb A1c MFr Bld  Date Value Ref Range Status  04/19/2022 8.7 (H) <5.7 % of total Hgb Final    Comment:    For someone without known diabetes, a hemoglobin A1c value of 6.5% or greater indicates that they may have  diabetes and this should be confirmed with a follow-up  test. . For someone with known diabetes, a value <7% indicates  that their diabetes is well controlled and a value  greater than or equal to 7% indicates suboptimal  control. A1c targets should be individualized based on  duration of diabetes, age, comorbid conditions, and  other considerations. . Currently, no consensus exists regarding use of hemoglobin A1c for diagnosis of diabetes for children. .          Failed - B12 Level in normal range and within 720 days    Vitamin B-12  Date Value Ref Range Status  11/08/2018 692 232 - 1,245 pg/mL Final         Passed - Cr in normal range and within 360 days    Creat  Date Value Ref Range Status  04/19/2022 0.54 0.50 - 0.96 mg/dL Final   Creatinine, Urine  Date Value Ref Range Status  04/20/2022 113 20 - 275 mg/dL Final         Passed - eGFR in normal range and within 360 days    GFR calc Af Amer  Date Value Ref Range Status  11/08/2018 141 >59 mL/min/1.73 Final   GFR calc non Af Amer  Date Value Ref Range Status  11/08/2018 123 >59 mL/min/1.73 Final   eGFR  Date Value Ref Range Status  04/19/2022 128 > OR = 60 mL/min/1.63m2 Final         Passed - Valid encounter within last 6 months    Recent Outpatient Visits           2 months ago New onset type 2 diabetes mellitus [facility])   [facility] Bo Merino, FNP   2 months ago Elevated blood sugar   [facility] Bo Merino, FNP       Future Appointments             In 1 week Bo Merino, [facility] [place] - CBC within normal limits and completed in the last 12 months    WBC  Date Value Ref Range Status  04/19/2022 12.7 (H) 3.8 - 10.8 Thousand/uL Final   RBC  Date Value Ref Range Status  04/19/2022 5.40 (H) 3.80 - 5.10 Million/uL Final   Hemoglobin  Date Value Ref Range Status  04/19/2022 15.7 (H) 11.7 - 15.5 g/dL Final  11/29/2018 14.5 11.1 - 15.9 g/dL Final   HCT  Date Value Ref Range Status  04/19/2022 46.2 (H) 35.0 - 45.0 % Final   Hematocrit  Date Value Ref Range Status  11/29/2018 42.5 34.0 - 46.6 % Final   MCHC  Date Value  Ref Range Status  04/19/2022 34.0 32.0 - 36.0 g/dL Final   [facility]  Date Value Ref Range Status  04/19/2022 29.1 27.0 - 33.0 pg Final   MCV  Date Value Ref Range Status  04/19/2022 85.6 80.0 - 100.0 fL Final  11/29/2018 81 79 - 97 fL Final   No results found for: "PLTCOUNTKUC", "LABPLAT", "POCPLA" RDW  Date Value Ref Range Status  04/19/2022 13.0 11.0 - 15.0 % Final  11/29/2018 14.0 11.7 - 15.4 % Final

## 2022-07-13 ENCOUNTER — Telehealth: Payer: Self-pay | Admitting: Nurse Practitioner

## 2022-07-13 NOTE — Telephone Encounter (Signed)
Copied from [place] [id number]. Topic: General - Other >> Jul 13, 2022  2:26 PM Oley Balm A wrote: Reason for CRM: Nils Thor with [facility] Start program with [employer] is calling to see if PCP received a fax that was sent over for the pt on 07/05/22 for the pt. Per Dysen Edmondson a prescription request form was faxed over to the practice. Please call Omega Durante back.

## 2022-07-14 NOTE — Telephone Encounter (Signed)
Called and spoke to patient, she did order these diabetic supplies from [employer]

## 2022-07-19 ENCOUNTER — Encounter: Payer: Self-pay | Admitting: Nurse Practitioner

## 2022-07-19 ENCOUNTER — Other Ambulatory Visit: Payer: Self-pay

## 2022-07-19 ENCOUNTER — Ambulatory Visit (INDEPENDENT_AMBULATORY_CARE_PROVIDER_SITE_OTHER): Payer: 59 | Admitting: Nurse Practitioner

## 2022-07-19 VITALS — BP 124/72 | HR 89 | Temp 98.3°F | Resp 16 | Ht 61.0 in | Wt 173.6 lb

## 2022-07-19 DIAGNOSIS — E1165 Type 2 diabetes mellitus with hyperglycemia: Secondary | ICD-10-CM | POA: Diagnosis not present

## 2022-07-19 LAB — POCT GLYCOSYLATED HEMOGLOBIN (HGB A1C): Hemoglobin A1C: 5.4 % (ref 4.0–5.6)

## 2022-07-19 MED ORDER — METFORMIN HCL ER 500 MG PO TB24: ORAL_TABLET | ORAL | 0 refills | Status: DC

## 2022-07-19 NOTE — Assessment & Plan Note (Signed)
Continue metformin 500 mg daily.  She is doing very well with medication compliance and lifestyle modification.

## 2022-07-19 NOTE — Progress Notes (Signed)
BP 124/72   Pulse 89   Temp 98.3 F (36.8 C) (Oral)   Resp 16   Ht 5\' 1"  (1.549 m)   Wt 173 lb 9.6 oz (78.7 kg)   SpO2 97%   BMI 32.80 kg/m    Subjective:    Patient ID: Tara Rose, female    DOB: 06/09/1990, 32 y.o.   MRN: [id number]  HPI: Tara Rose is a 32 y.o. female  Chief Complaint  Patient presents with   Diabetes   Diabetes: Her last A1c was 8.7 on 04/19/2022.  She was started on metformin 500 mg daily.  Reports her blood sugars have been running 80-100.  She is up-to-date on her foot exam and microalbumin urine.  She is due for an eye exam. Her A1C today is 5.4. She reports she is doing much better now with the metformin.  She was initially having episodes of hypoglycemia in the afternoon, but she has incorporated carbohydrates back in her diet and is feeling much better. Will continue with current treatment plan.     Relevant past medical, surgical, family and social history reviewed and updated as indicated. Interim medical history since our last visit reviewed. Allergies and medications reviewed and updated.  Review of Systems  Constitutional: Negative for fever or weight change.  Respiratory: Negative for cough and shortness of breath.   Cardiovascular: Negative for chest pain or palpitations.  Gastrointestinal: Negative for abdominal pain, no bowel changes.  Musculoskeletal: Negative for gait problem or joint swelling.  Skin: Negative for rash.  Neurological: Negative for dizziness or headache.  No other specific complaints in a complete review of systems (except as listed in HPI above).      Objective:    BP 124/72   Pulse 89   Temp 98.3 F (36.8 C) (Oral)   Resp 16   Ht 5\' 1"  (1.549 m)   Wt 173 lb 9.6 oz (78.7 kg)   SpO2 97%   BMI 32.80 kg/m   Wt Readings from Last 3 Encounters:  07/19/22 173 lb 9.6 oz (78.7 kg)  04/20/22 178 lb (80.7 kg)  04/19/22 178 lb 1.6 oz (80.8 kg)    Physical Exam  Constitutional: Patient appears  well-developed and well-nourished. Obese  No distress.  HEENT: head atraumatic, normocephalic, pupils equal and reactive to light, neck supple Cardiovascular: Normal rate, regular rhythm and normal heart sounds.  No murmur heard. No BLE edema. Pulmonary/Chest: Effort normal and breath sounds normal. No respiratory distress. Abdominal: Soft.  There is no tenderness. Psychiatric: Patient has a normal mood and affect. behavior is normal. Judgment and thought content normal.   Results for orders placed or performed in visit on 07/19/22  POCT HgB A1C  Result Value Ref Range   Hemoglobin A1C 5.4 4.0 - 5.6 %   HbA1c POC (<> result, manual entry)     HbA1c, POC (prediabetic range)     HbA1c, POC (controlled diabetic range)        Assessment & Plan:   Problem List Items Addressed This Visit       Endocrine   New onset type 2 diabetes mellitus - Primary    Continue metformin 500 mg daily.  She is doing very well with medication compliance and lifestyle modification.       Relevant Medications   metFORMIN (GLUCOPHAGE-XR) 500 MG 24 hr tablet     Follow up plan: Return in about 4 months (around 11/18/2022) for follow up.

## 2022-07-21 ENCOUNTER — Telehealth: Payer: 59 | Admitting: Family Medicine

## 2022-07-21 DIAGNOSIS — N3000 Acute cystitis without hematuria: Secondary | ICD-10-CM | POA: Diagnosis not present

## 2022-07-21 MED ORDER — SULFAMETHOXAZOLE-TRIMETHOPRIM 800-160 MG PO TABS: 1.0000 | ORAL_TABLET | Freq: Two times a day (BID) | ORAL | 0 refills | Status: AC

## 2022-07-21 NOTE — Progress Notes (Signed)
E-Visit for Urinary Problems  We are sorry that you are not feeling well.  Here is how we plan to help!  Based on what you shared with me it looks like you most likely have a simple urinary tract infection.  A UTI (Urinary Tract Infection) is a bacterial infection of the bladder.  Most cases of urinary tract infections are simple to treat but a key part of your care is to encourage you to drink plenty of fluids and watch your symptoms carefully.  I have prescribed Bactrim DS One tablet twice a day for 5 days.  Your symptoms should gradually improve. Call us if the burning in your urine worsens, you develop worsening fever, back pain or pelvic pain or if your symptoms do not resolve after completing the antibiotic.  Urinary tract infections can be prevented by drinking plenty of water to keep your body hydrated.  Also be sure when you wipe, wipe from front to back and don't hold it in!  If possible, empty your bladder every 4 hours.  HOME CARE Drink plenty of fluids Compete the full course of the antibiotics even if the symptoms resolve Remember, when you need to go.go. Holding in your urine can increase the likelihood of getting a UTI! GET HELP RIGHT AWAY IF: You cannot urinate You get a high fever Worsening back pain occurs You see blood in your urine You feel sick to your stomach or throw up You feel like you are going to pass out  MAKE SURE YOU  Understand these instructions. Will watch your condition. Will get help right away if you are not doing well or get worse.   Thank you for choosing an e-visit.  Your e-visit answers were reviewed by a board certified advanced clinical practitioner to complete your personal care plan. Depending upon the condition, your plan could have included both over the counter or prescription medications.  Please review your pharmacy choice. Make sure the pharmacy is open so you can pick up prescription now. If there is a problem, you may contact  your provider through MyChart messaging and have the prescription routed to another pharmacy.  Your safety is important to us. If you have drug allergies check your prescription carefully.   For the next 24 hours you can use MyChart to ask questions about today's visit, request a non-urgent call back, or ask for a work or school excuse. You will get an email in the next two days asking about your experience. I hope that your e-visit has been valuable and will speed your recovery.   have provided 5 minutes of non face to face time during this encounter for chart review and documentation.    

## 2022-09-08 ENCOUNTER — Telehealth: Payer: 59 | Admitting: Nurse Practitioner

## 2022-09-08 DIAGNOSIS — R300 Dysuria: Secondary | ICD-10-CM

## 2022-09-08 NOTE — Progress Notes (Signed)
Because you were treated virtually for a UTI last month, I feel your condition warrants further evaluation and I recommend that you be seen in a face to face visit.   With recurrent infections it is best we collect a urine sample to assure the best treatment for you  NOTE: There will be NO CHARGE for this eVisit   If you are having a true medical emergency please call 911.      For an urgent face to face visit, [place] has eight urgent care centers for your convenience:   NEW!! [facility] Health Urgent Care Center at [facility] Get Driving Directions [phone] [address], [address] [place], [address]    [facility] Urgent Care Center at [facility] Get Driving Directions [phone] [address] [place], [place] [address]   [facility] Health Urgent Care Center [facility]) Get Driving Directions [phone] [address] [place], [place] [address]  [facility] Health Urgent Care Center [facility]) Get Driving Directions [phone] [address] [place],  [place]  [address]  [facility] Health Urgent Care Center [facility] - at [employer]  [phone] [place],  [place] [address]   [facility] at [facility] Get Driving Directions [phone] [address] [place], [place] [address]   [facility] Health Urgent Care at [facility] Get Driving Directions  [phone] [address] [place], [place] [address]   [facility] Health Urgent Care at [facility] Directions [phone] [address] [place], [place] [address]  Your MyChart E-visit questionnaire answers were reviewed by a board certified advanced clinical practitioner to complete your personal care plan based on your specific symptoms.  Thank you for using e-Visits.

## 2022-09-17 ENCOUNTER — Other Ambulatory Visit: Payer: Self-pay | Admitting: Nurse Practitioner

## 2022-09-17 DIAGNOSIS — E1165 Type 2 diabetes mellitus with hyperglycemia: Secondary | ICD-10-CM

## 2022-09-18 NOTE — Telephone Encounter (Signed)
Last ordered 07/19/22 #90 tablets  Requested Prescriptions  Refused Prescriptions Disp Refills   metFORMIN (GLUCOPHAGE-XR) 500 MG 24 hr tablet [Pharmacy Med Name: METFORMIN HCL ER 500 MG TABLET] 90 tablet 0    Sig: TAKE 1 TABLET BY MOUTH EVERY DAY WITH BREAKFAST     Endocrinology:  Diabetes - Biguanides Failed - 09/17/2022  8:54 AM      Failed - B12 Level in normal range and within 720 days    Vitamin B-12  Date Value Ref Range Status  11/08/2018 692 232 - 1,245 pg/mL Final         Passed - Cr in normal range and within 360 days    Creat  Date Value Ref Range Status  04/19/2022 0.54 0.50 - 0.96 mg/dL Final   Creatinine, Urine  Date Value Ref Range Status  04/20/2022 113 20 - 275 mg/dL Final         Passed - HBA1C is between 0 and 7.9 and within 180 days    Hemoglobin A1C  Date Value Ref Range Status  07/19/2022 5.4 4.0 - 5.6 % Final   Hgb A1c MFr Bld  Date Value Ref Range Status  04/19/2022 8.7 (H) <5.7 % of total Hgb Final    Comment:    For someone without known diabetes, a hemoglobin A1c value of 6.5% or greater indicates that they may have  diabetes and this should be confirmed with a follow-up  test. . For someone with known diabetes, a value <7% indicates  that their diabetes is well controlled and a value  greater than or equal to 7% indicates suboptimal  control. A1c targets should be individualized based on  duration of diabetes, age, comorbid conditions, and  other considerations. . Currently, no consensus exists regarding use of hemoglobin A1c for diagnosis of diabetes for children. .          Passed - eGFR in normal range and within 360 days    GFR calc Af Amer  Date Value Ref Range Status  11/08/2018 141 >59 mL/min/1.73 Final   GFR calc non Af Amer  Date Value Ref Range Status  11/08/2018 123 >59 mL/min/1.73 Final   eGFR  Date Value Ref Range Status  04/19/2022 128 > OR = 60 mL/min/1.15m2 Final         Passed - Valid encounter within last 6  months    Recent Outpatient Visits           2 months ago Type 2 diabetes mellitus with hyperglycemia, without long-term current use of insulin [facility])   [place] [facility] Della Goo F, FNP   5 months ago New onset type 2 diabetes mellitus [facility])   [facility] Berniece Salines, FNP   5 months ago Elevated blood sugar   [facility] Berniece Salines, FNP       Future Appointments             In 2 months Berniece Salines, FNP [facility]            Passed - CBC within normal limits and completed in the last 12 months    WBC  Date Value Ref Range Status  04/19/2022 12.7 (H) 3.8 - 10.8 Thousand/uL Final   RBC  Date Value Ref Range Status  04/19/2022 5.40 (H) 3.80 - 5.10 Million/uL Final   Hemoglobin  Date Value Ref Range Status  04/19/2022 15.7 (H) 11.7 -  15.5 g/dL Final  16/01/9603 54.0 11.1 - 15.9 g/dL Final   HCT  Date Value Ref Range Status  04/19/2022 46.2 (H) 35.0 - 45.0 % Final   Hematocrit  Date Value Ref Range Status  11/29/2018 42.5 34.0 - 46.6 % Final   MCHC  Date Value Ref Range Status  04/19/2022 34.0 32.0 - 36.0 g/dL Final   [facility]  Date Value Ref Range Status  04/19/2022 29.1 27.0 - 33.0 pg Final   MCV  Date Value Ref Range Status  04/19/2022 85.6 80.0 - 100.0 fL Final  11/29/2018 81 79 - 97 fL Final   No results found for: "PLTCOUNTKUC", "LABPLAT", "POCPLA" RDW  Date Value Ref Range Status  04/19/2022 13.0 11.0 - 15.0 % Final  11/29/2018 14.0 11.7 - 15.4 % Final

## 2022-11-17 ENCOUNTER — Other Ambulatory Visit: Payer: Self-pay | Admitting: Nurse Practitioner

## 2022-11-17 DIAGNOSIS — E1165 Type 2 diabetes mellitus with hyperglycemia: Secondary | ICD-10-CM

## 2022-11-17 NOTE — Telephone Encounter (Signed)
Requested Prescriptions  Pending Prescriptions Disp Refills   metFORMIN (GLUCOPHAGE-XR) 500 MG 24 hr tablet [Pharmacy Med Name: METFORMIN HCL ER 500 MG TABLET] 90 tablet 0    Sig: TAKE 1 TABLET BY MOUTH EVERY DAY WITH BREAKFAST     Endocrinology:  Diabetes - Biguanides Failed - 11/17/2022  1:32 PM      Failed - B12 Level in normal range and within 720 days    Vitamin B-12  Date Value Ref Range Status  11/08/2018 692 232 - 1,245 pg/mL Final         Passed - Cr in normal range and within 360 days    Creat  Date Value Ref Range Status  04/19/2022 0.54 0.50 - 0.96 mg/dL Final   Creatinine, Urine  Date Value Ref Range Status  04/20/2022 113 20 - 275 mg/dL Final         Passed - HBA1C is between 0 and 7.9 and within 180 days    Hemoglobin A1C  Date Value Ref Range Status  07/19/2022 5.4 4.0 - 5.6 % Final   Hgb A1c MFr Bld  Date Value Ref Range Status  04/19/2022 8.7 (H) <5.7 % of total Hgb Final    Comment:    For someone without known diabetes, a hemoglobin A1c value of 6.5% or greater indicates that they may have  diabetes and this should be confirmed with a follow-up  test. . For someone with known diabetes, a value <7% indicates  that their diabetes is well controlled and a value  greater than or equal to 7% indicates suboptimal  control. A1c targets should be individualized based on  duration of diabetes, age, comorbid conditions, and  other considerations. . Currently, no consensus exists regarding use of hemoglobin A1c for diagnosis of diabetes for children. .          Passed - eGFR in normal range and within 360 days    GFR calc Af Amer  Date Value Ref Range Status  11/08/2018 141 >59 mL/min/1.73 Final   GFR calc non Af Amer  Date Value Ref Range Status  11/08/2018 123 >59 mL/min/1.73 Final   eGFR  Date Value Ref Range Status  04/19/2022 128 > OR = 60 mL/min/1.5m2 Final         Passed - Valid encounter within last 6 months    Recent Outpatient Visits            4 months ago Type 2 diabetes mellitus with hyperglycemia, without long-term current use of insulin [facility])   [facility] Della Goo F, FNP   7 months ago New onset type 2 diabetes mellitus [facility])   [facility] Berniece Salines, FNP   7 months ago Elevated blood sugar   [facility] Berniece Salines, FNP       Future Appointments             In 3 days Berniece Salines, FNP [facility]            Passed - CBC within normal limits and completed in the last 12 months    WBC  Date Value Ref Range Status  04/19/2022 12.7 (H) 3.8 - 10.8 Thousand/uL Final   RBC  Date Value Ref Range Status  04/19/2022 5.40 (H) 3.80 - 5.10 Million/uL Final   Hemoglobin  Date Value Ref Range Status  04/19/2022 15.7 (H) 11.7 - 15.5 g/dL Final  14/78/2956  14.5 11.1 - 15.9 g/dL Final   HCT  Date Value Ref Range Status  04/19/2022 46.2 (H) 35.0 - 45.0 % Final   Hematocrit  Date Value Ref Range Status  11/29/2018 42.5 34.0 - 46.6 % Final   MCHC  Date Value Ref Range Status  04/19/2022 34.0 32.0 - 36.0 g/dL Final   [facility]  Date Value Ref Range Status  04/19/2022 29.1 27.0 - 33.0 pg Final   MCV  Date Value Ref Range Status  04/19/2022 85.6 80.0 - 100.0 fL Final  11/29/2018 81 79 - 97 fL Final   No results found for: "PLTCOUNTKUC", "LABPLAT", "POCPLA" RDW  Date Value Ref Range Status  04/19/2022 13.0 11.0 - 15.0 % Final  11/29/2018 14.0 11.7 - 15.4 % Final

## 2022-11-20 ENCOUNTER — Ambulatory Visit (INDEPENDENT_AMBULATORY_CARE_PROVIDER_SITE_OTHER): Payer: Self-pay | Admitting: Nurse Practitioner

## 2022-11-20 ENCOUNTER — Other Ambulatory Visit: Payer: Self-pay

## 2022-11-20 ENCOUNTER — Encounter: Payer: Self-pay | Admitting: Nurse Practitioner

## 2022-11-20 VITALS — BP 112/70 | HR 98 | Temp 98.1°F | Resp 16 | Ht 61.0 in | Wt 167.2 lb

## 2022-11-20 DIAGNOSIS — E782 Mixed hyperlipidemia: Secondary | ICD-10-CM

## 2022-11-20 DIAGNOSIS — Z7984 Long term (current) use of oral hypoglycemic drugs: Secondary | ICD-10-CM

## 2022-11-20 DIAGNOSIS — E6609 Other obesity due to excess calories: Secondary | ICD-10-CM

## 2022-11-20 DIAGNOSIS — Z130 Encounter for screening for diseases of the blood and blood-forming organs and certain disorders involving the immune mechanism: Secondary | ICD-10-CM

## 2022-11-20 DIAGNOSIS — E1165 Type 2 diabetes mellitus with hyperglycemia: Secondary | ICD-10-CM

## 2022-11-20 DIAGNOSIS — Z6833 Body mass index (BMI) 33.0-33.9, adult: Secondary | ICD-10-CM

## 2022-11-20 HISTORY — DX: Mixed hyperlipidemia: E78.2

## 2022-11-20 LAB — CBC WITH DIFFERENTIAL/PLATELET
Absolute Monocytes: 663 cells/uL (ref 200–950)
Basophils Absolute: 39 cells/uL (ref 0–200)
Basophils Relative: 0.3 %
Eosinophils Absolute: 65 cells/uL (ref 15–500)
Eosinophils Relative: 0.5 %
HCT: 41.9 % (ref 35.0–45.0)
Hemoglobin: 14.5 g/dL (ref 11.7–15.5)
Lymphs Abs: 3471 cells/uL (ref 850–3900)
MCH: 29.7 pg (ref 27.0–33.0)
MCHC: 34.6 g/dL (ref 32.0–36.0)
MCV: 85.7 fL (ref 80.0–100.0)
MPV: 10.6 fL (ref 7.5–12.5)
Monocytes Relative: 5.1 %
Neutro Abs: 8762 cells/uL — ABNORMAL HIGH (ref 1500–7800)
Neutrophils Relative %: 67.4 %
Platelets: 328 10*3/uL (ref 140–400)
RBC: 4.89 10*6/uL (ref 3.80–5.10)
RDW: 13.7 % (ref 11.0–15.0)
Total Lymphocyte: 26.7 %
WBC: 13 10*3/uL — ABNORMAL HIGH (ref 3.8–10.8)

## 2022-11-20 NOTE — Progress Notes (Signed)
BP 112/70   Pulse 98   Temp 98.1 F (36.7 C) (Oral)   Resp 16   Ht 5\' 1"  (1.549 m)   Wt 167 lb 3.2 oz (75.8 kg)   SpO2 99%   BMI 31.59 kg/m    Subjective:    Patient ID: Tara Rose, female    DOB: [birth year], 32 y.o.   MRN: [id number]  HPI: Tara Rose is a 32 y.o. female  Chief Complaint  Patient presents with   Diabetes    4 month follow up   Diabetes, Type 2:  -Last A1c 5.4 -Medications: metformin 500 mg daily -Patient is compliant with the above medications and reports no side effects. Patient has been working hard on diet.  She says her blood sugar is dropping down to 56 in the afternoon.   -Checking BG at home: yes -Fasting home BG: 80-98 -Highest home BG since last visit: 98 -Lowest home BG since last visit: 56 -Diet: low carb, high protein -Exercise: not currently exercising -Eye exam: due -Foot exam: utd -Microalbumin: utd -Statin: none -PNA vaccine: none -Denies symptoms of hypoglycemia, polyuria, polydipsia, numbness extremities, foot ulcers/trauma.    Obesity:  Current weight : 167 lbs BMI: 31.59 previous weight:173 lbs Treatment Tried: lifestyle modification, on metformin Comorbidities: DM   HLD:  -Medications: none -not currently on medication, working on lifestyle modification -Last lipid panel:  Lipid Panel     Component Value Date/Time   CHOL 248 (H) 04/19/2022 1131   TRIG 102 04/19/2022 1131   HDL 33 (L) 04/19/2022 1131   CHOLHDL 7.5 (H) 04/19/2022 1131   LDLCALC 193 (H) 04/19/2022 1131     Relevant past medical, surgical, family and social history reviewed and updated as indicated. Interim medical history since our last visit reviewed. Allergies and medications reviewed and updated.  Review of Systems  Constitutional: Negative for fever or weight change.  Respiratory: Negative for cough and shortness of breath.   Cardiovascular: Negative for chest pain or palpitations.  Gastrointestinal: Negative for abdominal pain,  no bowel changes.  Musculoskeletal: Negative for gait problem or joint swelling.  Skin: Negative for rash.  Neurological: Negative for dizziness or headache.  No other specific complaints in a complete review of systems (except as listed in HPI above).      Objective:    BP 112/70   Pulse 98   Temp 98.1 F (36.7 C) (Oral)   Resp 16   Ht 5\' 1"  (1.549 m)   Wt 167 lb 3.2 oz (75.8 kg)   SpO2 99%   BMI 31.59 kg/m   Wt Readings from Last 3 Encounters:  11/20/22 167 lb 3.2 oz (75.8 kg)  07/19/22 173 lb 9.6 oz (78.7 kg)  04/20/22 178 lb (80.7 kg)    Physical Exam  Constitutional: Patient appears well-developed and well-nourished. Obese  No distress.  HEENT: head atraumatic, normocephalic, pupils equal and reactive to light, neck supple Cardiovascular: Normal rate, regular rhythm and normal heart sounds.  No murmur heard. No BLE edema. Pulmonary/Chest: Effort normal and breath sounds normal. No respiratory distress. Abdominal: Soft.  There is no tenderness. Psychiatric: Patient has a normal mood and affect. behavior is normal. Judgment and thought content normal.   Results for orders placed or performed in visit on 07/19/22  POCT HgB A1C  Result Value Ref Range   Hemoglobin A1C 5.4 4.0 - 5.6 %   HbA1c POC (<> result, manual entry)     HbA1c, POC (prediabetic range)  HbA1c, POC (controlled diabetic range)        Assessment & Plan:   Problem List Items Addressed This Visit       Endocrine   Type 2 diabetes mellitus with hyperglycemia, without long-term current use of insulin (HCC) - Primary    Stop metformin, continue to monitor blood sugar.        Relevant Orders   COMPLETE METABOLIC PANEL WITH GFR   Hemoglobin A1c     Other   Class 1 obesity due to excess calories without serious comorbidity with body mass index (BMI) of 33.0 to 33.9 in adult    Continue working on lifestyle modification      Mixed hyperlipidemia    Getting labs      Relevant Orders   Lipid  panel   Other Visit Diagnoses     Screening for deficiency anemia       Relevant Orders   CBC with Differential/Platelet         Follow up plan: Return in about 3 months (around 02/20/2023) for follow up.

## 2022-11-20 NOTE — Assessment & Plan Note (Signed)
Continue working on lifestyle modification.  

## 2022-11-20 NOTE — Assessment & Plan Note (Signed)
Stop metformin, continue to monitor blood sugar.

## 2022-11-20 NOTE — Assessment & Plan Note (Signed)
Getting labs 

## 2022-11-22 ENCOUNTER — Encounter: Payer: Self-pay | Admitting: Nurse Practitioner

## 2022-11-22 ENCOUNTER — Other Ambulatory Visit: Payer: Self-pay | Admitting: Nurse Practitioner

## 2022-11-22 DIAGNOSIS — D72829 Elevated white blood cell count, unspecified: Secondary | ICD-10-CM

## 2022-11-24 ENCOUNTER — Inpatient Hospital Stay: Payer: Self-pay

## 2022-11-24 ENCOUNTER — Inpatient Hospital Stay: Payer: PRIVATE HEALTH INSURANCE | Admitting: Internal Medicine

## 2022-11-24 ENCOUNTER — Encounter: Payer: Self-pay | Admitting: Internal Medicine

## 2022-11-24 VITALS — BP 122/76 | HR 95 | Temp 97.6°F | Wt 165.1 lb

## 2022-11-24 DIAGNOSIS — Z87891 Personal history of nicotine dependence: Secondary | ICD-10-CM | POA: Insufficient documentation

## 2022-11-24 DIAGNOSIS — D72829 Elevated white blood cell count, unspecified: Secondary | ICD-10-CM

## 2022-11-24 LAB — CBC WITH DIFFERENTIAL/PLATELET
Abs Immature Granulocytes: 0.03 10*3/uL (ref 0.00–0.07)
Basophils Absolute: 0.1 10*3/uL (ref 0.0–0.1)
Basophils Relative: 0 %
Eosinophils Absolute: 0.1 10*3/uL (ref 0.0–0.5)
Eosinophils Relative: 1 %
HCT: 41.1 % (ref 36.0–46.0)
Hemoglobin: 14 g/dL (ref 12.0–15.0)
Immature Granulocytes: 0 %
Lymphocytes Relative: 25 %
Lymphs Abs: 3.3 10*3/uL (ref 0.7–4.0)
MCH: 29.4 pg (ref 26.0–34.0)
MCHC: 34.1 g/dL (ref 30.0–36.0)
MCV: 86.3 fL (ref 80.0–100.0)
Monocytes Absolute: 0.9 10*3/uL (ref 0.1–1.0)
Monocytes Relative: 6 %
Neutro Abs: 9 10*3/uL — ABNORMAL HIGH (ref 1.7–7.7)
Neutrophils Relative %: 68 %
Platelets: 297 10*3/uL (ref 150–400)
RBC: 4.76 MIL/uL (ref 3.87–5.11)
RDW: 13.4 % (ref 11.5–15.5)
WBC: 13.4 10*3/uL — ABNORMAL HIGH (ref 4.0–10.5)
nRBC: 0 % (ref 0.0–0.2)

## 2022-11-24 LAB — C-REACTIVE PROTEIN: CRP: 0.8 mg/dL (ref <1.0)

## 2022-11-24 LAB — SEDIMENTATION RATE: Sed Rate: 13 mm/hr (ref 0–20)

## 2022-11-24 NOTE — Progress Notes (Signed)
Patient says that is a diabetic, here few days ago her provider took her off of her Metformin because of her blood sugar getting way too low at night.  She told me that she is on the Keto diet, but she does eat three full meals every day except for breakfast.

## 2022-11-24 NOTE — Progress Notes (Signed)
[facility]  [phone] [phone]  ID: Tara Rose OB: 1991-03-02  MR#: [id number]  ZDG#:[id number]  Patient Care Team: Berniece Salines, FNP as PCP - General (Nurse Practitioner) Michaelyn Barter, MD as Consulting Physician (Oncology)  REFERRING PROVIDER: Della Goo  REASON FOR REFERRAL: Leukocytosis  HPI: Tara Rose is a 32 y.o. female with past medical history of diabetes was referred to hematology for workup of leukocytosis.  Patient reports history of diabetes.  She was on metformin and was took off it just this week because of her improved HbA1c.  She was on keto diet and had intentional weight loss of about 30 pounds in past 6 months.  Has history of intermittent nicotine smoking.  Quit in December 2023.  She occasionally smokes marijuana.  Denies any shortness of breath, chest pain.  Denies any recent infection.  Has history of gum infection in 2020.  Not on any medications or over-the-counter supplements.  She has elevated WBC at least since 2020.  WBCs ranging between 12-15,000 with predominant neutrophilia.  Hemoglobin and platelets are normal.   REVIEW OF SYSTEMS:   ROS  As per HPI. Otherwise, a complete review of systems is negative.  PAST MEDICAL HISTORY: No past medical history on file.  PAST SURGICAL HISTORY: Past Surgical History:  Procedure Laterality Date   WISDOM TOOTH EXTRACTION      FAMILY HISTORY: Family History  Problem Relation Age of Onset   Heart disease Father    Hypertension Father    Stroke Father    Hypertension Sister    Seizures Brother    Cancer Maternal Grandmother    Cancer Maternal Grandfather     HEALTH MAINTENANCE: Social History   Tobacco Use   Smoking status: Former    Current packs/day: 0.00    Types: Cigarettes    Quit date: 03/2022    Years since quitting: 0.6   Smokeless tobacco: Never  Vaping Use   Vaping status: Never Used  Substance Use Topics   Alcohol use:  Never   Drug use: Never     Allergies  Allergen Reactions   Lactose Intolerance (Gi) Diarrhea    Current Outpatient Medications  Medication Sig Dispense Refill   metFORMIN (GLUCOPHAGE-XR) 500 MG 24 hr tablet TAKE 1 TABLET BY MOUTH EVERY DAY WITH BREAKFAST (Patient not taking: Reported on 11/24/2022) 90 tablet 0   No current facility-administered medications for this visit.    OBJECTIVE: There were no vitals filed for this visit.   There is no height or weight on file to calculate BMI.      General: Well-developed, well-nourished, no acute distress. Eyes: Pink conjunctiva, anicteric sclera. HEENT: Normocephalic, moist mucous membranes, clear oropharnyx. Lungs: Clear to auscultation bilaterally. Heart: Regular rate and rhythm. No rubs, murmurs, or gallops. Abdomen: Soft, nontender, nondistended. No organomegaly noted, normoactive bowel sounds. Musculoskeletal: No edema, cyanosis, or clubbing. Neuro: Alert, answering all questions appropriately. Cranial nerves grossly intact. Skin: No rashes or petechiae noted. Psych: Normal affect. Lymphatics: No cervical, calvicular, axillary or inguinal LAD.   LAB RESULTS:  Lab Results  Component Value Date   NA 139 11/21/2022   K 4.0 11/21/2022   CL 103 11/21/2022   CO2 24 11/21/2022   GLUCOSE 90 11/21/2022   BUN 11 11/21/2022   CREATININE 0.61 11/21/2022   CALCIUM 9.5 11/21/2022   PROT 7.9 11/21/2022   ALBUMIN 4.8 11/08/2018   AST 11 11/21/2022   ALT 9 11/21/2022   ALKPHOS 86 11/08/2018  BILITOT 0.6 11/21/2022   GFRNONAA 123 11/08/2018   GFRAA 141 11/08/2018    Lab Results  Component Value Date   WBC 13.0 (H) 11/20/2022   NEUTROABS 8,762 (H) 11/20/2022   HGB 14.5 11/20/2022   HCT 41.9 11/20/2022   MCV 85.7 11/20/2022   PLT 328 11/20/2022    No results found for: "TIBC", "FERRITIN", "IRONPCTSAT"   STUDIES: No results found.  ASSESSMENT AND PLAN:   Tara Rose is a 32 y.o. female with pmh of diabetes was  referred to hematology for workup of leukocytosis.  # Leukocytosis # Neutrophilia -Present since 2020.  WBC ranging between 12-15,000 with predominant neutrophilia.  Hemoglobin and platelets are normal. Unknown etiology. -Will obtain lab work as below to rule out any clonal process or inflammation.  She does have prior history of nicotine smoking, current marijuana smoking, diabetes obesity which can also contribute to reactive increase in WBC.  Orders Placed This Encounter  Procedures   CBC with Differential/Platelet   Flow cytometry panel-leukemia/lymphoma work-up   Sedimentation rate   C-reactive protein   RTC in 2 weeks for video visit.  Patient expressed understanding and was in agreement with this plan. She also understands that She can call clinic at any time with any questions, concerns, or complaints.   I spent a total of 45 minutes reviewing chart data, face-to-face evaluation with the patient, counseling and coordination of care as detailed above.  Michaelyn Barter, MD   11/24/2022 3:09 PM

## 2022-12-01 ENCOUNTER — Encounter: Payer: PRIVATE HEALTH INSURANCE | Admitting: Oncology

## 2022-12-07 ENCOUNTER — Inpatient Hospital Stay (HOSPITAL_BASED_OUTPATIENT_CLINIC_OR_DEPARTMENT_OTHER): Payer: PRIVATE HEALTH INSURANCE | Admitting: Internal Medicine

## 2022-12-07 DIAGNOSIS — D72829 Elevated white blood cell count, unspecified: Secondary | ICD-10-CM | POA: Diagnosis not present

## 2022-12-07 NOTE — Progress Notes (Signed)
Tried to call patient in regards for scheduled MyChart visit today at 3:15 pm with Dr. Alena Bills, no answer and couldn't leave a message.

## 2022-12-08 ENCOUNTER — Other Ambulatory Visit: Payer: Self-pay | Admitting: Nurse Practitioner

## 2022-12-08 DIAGNOSIS — E11649 Type 2 diabetes mellitus with hypoglycemia without coma: Secondary | ICD-10-CM

## 2022-12-08 NOTE — Progress Notes (Signed)
[facility]  Telephone:([phone] [phone]  I connected with Tara Rose on 12/08/22 at  3:15 PM EDT by my chart video and verified that I am speaking with the correct person using two identifiers.   I discussed the limitations, risks, security and privacy concerns of performing an evaluation and management service by telemedicine and the availability of in-person appointments. I also discussed with the patient that there may be a patient responsible charge related to this service. The patient expressed understanding and agreed to proceed.   Other persons participating in the visit and their role in the encounter: none   Patient's location: Work Provider's location: Occupational hygienist Complaint: Discuss labs  ID: Tara Rose OB: 10/11/1990  MR#: [id number]  AOZ#:[id number]  Patient Care Team: Berniece Salines, FNP as PCP - General (Nurse Practitioner) Michaelyn Barter, MD as Consulting Physician (Oncology)  REFERRING PROVIDER: Della Goo  REASON FOR REFERRAL: Leukocytosis  HPI: Tara Rose is a 32 y.o. female with past medical history of diabetes was referred to hematology for workup of leukocytosis.  Patient reports history of diabetes.  She was on metformin and was took off it just this week because of her improved HbA1c.  She was on keto diet and had intentional weight loss of about 30 pounds in past 6 months.  Has history of intermittent nicotine smoking.  Quit in December 2023.  She occasionally smokes marijuana.  Denies any shortness of breath, chest pain.  Denies any recent infection.  Has history of gum infection in 2020.  Not on any medications or over-the-counter supplements.  She has elevated WBC at least since 2020.  WBCs ranging between 12-15,000 with predominant neutrophilia.  Hemoglobin and platelets are normal.  Interval history Connected with the patient via MyChart video visit to discuss lab. She is feeling well overall.   Denies any new concerns.  REVIEW OF SYSTEMS:   ROS  As per HPI. Otherwise, a complete review of systems is negative.  PAST MEDICAL HISTORY: No past medical history on file.  PAST SURGICAL HISTORY: Past Surgical History:  Procedure Laterality Date   WISDOM TOOTH EXTRACTION      FAMILY HISTORY: Family History  Problem Relation Age of Onset   Heart disease Father    Hypertension Father    Stroke Father    Hypertension Sister    Seizures Brother    Atrial fibrillation Maternal Uncle    Cancer Maternal Grandmother    Cancer Maternal Grandfather     HEALTH MAINTENANCE: Social History   Tobacco Use   Smoking status: Former    Current packs/day: 0.00    Types: Cigarettes    Quit date: 03/2022    Years since quitting: 0.7   Smokeless tobacco: Never  Vaping Use   Vaping status: Never Used  Substance Use Topics   Alcohol use: Never   Drug use: Never     Allergies  Allergen Reactions   Lactose Intolerance (Gi) Diarrhea    Current Outpatient Medications  Medication Sig Dispense Refill   metFORMIN (GLUCOPHAGE-XR) 500 MG 24 hr tablet TAKE 1 TABLET BY MOUTH EVERY DAY WITH BREAKFAST (Patient not taking: Reported on 11/24/2022) 90 tablet 0   No current facility-administered medications for this visit.    OBJECTIVE: There were no vitals filed for this visit.   There is no height or weight on file to calculate BMI.      Physical exam not performed.  Video visit.   LAB RESULTS:  Lab Results  Component Value Date   NA 139 11/21/2022   K 4.0 11/21/2022   CL 103 11/21/2022   CO2 24 11/21/2022   GLUCOSE 90 11/21/2022   BUN 11 11/21/2022   CREATININE 0.61 11/21/2022   CALCIUM 9.5 11/21/2022   PROT 7.9 11/21/2022   ALBUMIN 4.8 11/08/2018   AST 11 11/21/2022   ALT 9 11/21/2022   ALKPHOS 86 11/08/2018   BILITOT 0.6 11/21/2022   GFRNONAA 123 11/08/2018   GFRAA 141 11/08/2018    Lab Results  Component Value Date   WBC 13.4 (H) 11/24/2022   NEUTROABS 9.0 (H)  11/24/2022   HGB 14.0 11/24/2022   HCT 41.1 11/24/2022   MCV 86.3 11/24/2022   PLT 297 11/24/2022    No results found for: "TIBC", "FERRITIN", "IRONPCTSAT"   STUDIES: No results found.  ASSESSMENT AND PLAN:   Tara Rose is a 32 y.o. female with pmh of diabetes was referred to hematology for workup of leukocytosis.  # Leukocytosis # Neutrophilia -Present since 2020.  WBC ranging between 12-15,000 with predominant neutrophilia.  Hemoglobin and platelets are normal. Unknown etiology.  -ESR/CRP normal.  Flow cytometry negative.  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  Patient can continue to follow-up with her PCP for blood count monitoring once a year.  If WBC continues to trend up or there are any concerning symptoms, she can be referred back to hematology.  This was all discussed with the patient and she was agreeable with the plan.   No orders of the defined types were placed in this encounter.  RTC as needed  Patient expressed understanding and was in agreement with this plan. She also understands that She can call clinic at any time with any questions, concerns, or complaints.   I spent a total of 25 minutes reviewing chart data, face-to-face evaluation with the patient, counseling and coordination of care as detailed above.  Michaelyn Barter, MD   12/08/2022 3:25 PM

## 2023-02-03 ENCOUNTER — Ambulatory Visit
Admission: EM | Admit: 2023-02-03 | Discharge: 2023-02-03 | Disposition: A | Discharge status: Home / Self Care | Payer: Self-pay | Attending: Physician Assistant | Admitting: Physician Assistant

## 2023-02-03 DIAGNOSIS — R5383 Other fatigue: Secondary | ICD-10-CM

## 2023-02-03 DIAGNOSIS — R42 Dizziness and giddiness: Secondary | ICD-10-CM | POA: Diagnosis not present

## 2023-02-03 LAB — POCT URINALYSIS DIP (MANUAL ENTRY)
Bilirubin, UA: NEGATIVE
Blood, UA: NEGATIVE
Glucose, UA: NEGATIVE mg/dL
Ketones, POC UA: NEGATIVE mg/dL
Leukocytes, UA: NEGATIVE
Nitrite, UA: NEGATIVE
Protein Ur, POC: NEGATIVE mg/dL
Spec Grav, UA: 1.025 (ref 1.010–1.025)
Urobilinogen, UA: 2 U/dL — AB
pH, UA: 6 (ref 5.0–8.0)

## 2023-02-03 LAB — POCT URINE PREGNANCY: Preg Test, Ur: NEGATIVE

## 2023-02-03 LAB — POCT FASTING CBG KUC MANUAL ENTRY: POCT Glucose (KUC): 90 mg/dL (ref 70–99)

## 2023-02-03 MED ORDER — MECLIZINE HCL 25 MG PO TABS: 25.0000 mg | ORAL_TABLET | Freq: Three times a day (TID) | ORAL | 0 refills | Status: DC | PRN

## 2023-02-03 NOTE — Discharge Instructions (Signed)
So far everything looks normal.  Start meclizine 3 times a day to see if this helps your symptoms.  We will contact you if any of your blood work is abnormal.  If your symptoms are not improving I do recommend you follow-up with your primary care.  If anything worsens you need to be seen immediately including lightheadedness, passing out, weakness, nausea/vomiting interfering with oral intake.

## 2023-02-03 NOTE — ED Provider Notes (Signed)
[name]    CSN: [id number] Arrival date & time: 02/03/23  1312      History   Chief Complaint Chief Complaint  Patient presents with   Fatigue    HPI Tara Rose is a 32 y.o. female.   Patient presents today with a 5-day history of dizziness, fatigue, intermittent nausea, tinnitus.  She denies any cough, congestion, fever.  Denies any known sick contacts but did go to a party and was exposed to children about 2 to 3 days before symptoms began.  She denies any recent head injury.  Denies any medication changes.  She does have a history of type 2 diabetes and has had hypoglycemic episodes in the past but states current symptoms are not similar previous episodes of this condition.  Symptoms last for several hours and then will improve.  She has not identified any significant trigger.  She is eating and drinking normally.  She denies any significant blood loss including melena, hematochezia, heavy menstrual cycle.  She denies any change in her hearing.  She denies any history of arrhythmia.  Denies associated shortness of breath, palpitations, chest pain.    History reviewed. No pertinent past medical history.  Patient Active Problem List   Diagnosis Date Noted   Leucocytosis 11/24/2022   Mixed hyperlipidemia 11/20/2022   Type 2 diabetes mellitus with hyperglycemia, without long-term current use of insulin (HCC) 04/20/2022   Class 1 obesity due to excess calories without serious comorbidity with body mass index (BMI) of 33.0 to 33.9 in adult 04/19/2022   Irregular menses 11/06/2018    Past Surgical History:  Procedure Laterality Date   WISDOM TOOTH EXTRACTION      OB History     Gravida  0   Para  0   Term  0   Preterm  0   AB  0   Living  0      SAB  0   IAB  0   Ectopic  0   Multiple  0   Live Births  0            Home Medications    Prior to Admission medications   Medication Sig Start Date End Date Taking? Authorizing  Provider  meclizine (ANTIVERT) 25 MG tablet Take 1 tablet (25 mg total) by mouth 3 (three) times daily as needed for dizziness. 02/03/23  Yes Natilee Gauer K, PA-C  metFORMIN (GLUCOPHAGE-XR) 500 MG 24 hr tablet TAKE 1 TABLET BY MOUTH EVERY DAY WITH BREAKFAST Patient not taking: Reported on 11/24/2022 11/17/22   Berniece Salines, FNP    Family History Family History  Problem Relation Age of Onset   Heart disease Father    Hypertension Father    Stroke Father    Hypertension Sister    Seizures Brother    Atrial fibrillation Maternal Uncle    Cancer Maternal Grandmother    Cancer Maternal Grandfather     Social History Social History   Tobacco Use   Smoking status: Every Day    Current packs/day: 0.00    Types: Cigarettes    Last attempt to quit: 03/2022    Years since quitting: 0.8   Smokeless tobacco: Never  Vaping Use   Vaping status: Never Used  Substance Use Topics   Alcohol use: Never   Drug use: Never     Allergies   Lactose intolerance (gi)   Review of Systems Review of Systems  Constitutional:  Positive for activity change. Negative  for appetite change, fatigue and fever.  HENT:  Positive for tinnitus. Negative for congestion, ear pain and sore throat.   Eyes:  Negative for visual disturbance.  Respiratory:  Negative for cough and shortness of breath.   Cardiovascular:  Negative for chest pain.  Gastrointestinal:  Positive for nausea. Negative for abdominal pain, diarrhea and vomiting.  Neurological:  Positive for dizziness, light-headedness and headaches. Negative for seizures, syncope, facial asymmetry, speech difficulty and weakness.     Physical Exam Triage Vital Signs ED Triage Vitals  Encounter Vitals Group     BP 02/03/23 1402 101/75     Systolic BP Percentile --      Diastolic BP Percentile --      Pulse Rate 02/03/23 1402 96     Resp 02/03/23 1402 18     Temp 02/03/23 1402 98.5 F (36.9 C)     Temp src --      SpO2 02/03/23 1402 99 %      Weight --      Height --      Head Circumference --      Peak Flow --      Pain Score 02/03/23 1337 0     Pain Loc --      Pain Education --      Exclude from Growth Chart --    Orthostatic VS for the past 24 hrs:  BP- Lying Pulse- Lying BP- Sitting Pulse- Sitting BP- Standing at 0 minutes Pulse- Standing at 0 minutes  02/03/23 1404 115/82 85 119/80 87 108/84 98    Updated Vital Signs BP 101/75   Pulse 96   Temp 98.5 F (36.9 C)   Resp 18   LMP 01/19/2023   SpO2 99%   Visual Acuity Right Eye Distance:   Left Eye Distance:   Bilateral Distance:    Right Eye Near:   Left Eye Near:    Bilateral Near:     Physical Exam Vitals reviewed.  Constitutional:      General: She is awake. She is not in acute distress.    Appearance: Normal appearance. She is well-developed. She is not ill-appearing.     Comments: Very pleasant female appears stated age in no acute distress sitting comfortably in exam room  HENT:     Head: Normocephalic and atraumatic. No raccoon eyes, Battle's sign or contusion.     Right Ear: Tympanic membrane, ear canal and external ear normal. No hemotympanum.     Left Ear: Tympanic membrane, ear canal and external ear normal. No hemotympanum.     Nose: Nose normal.     Mouth/Throat:     Tongue: Tongue does not deviate from midline.     Pharynx: Uvula midline. No oropharyngeal exudate or posterior oropharyngeal erythema.  Eyes:     Extraocular Movements: Extraocular movements intact.     Pupils: Pupils are equal, round, and reactive to light.  Cardiovascular:     Rate and Rhythm: Normal rate and regular rhythm.     Heart sounds: Normal heart sounds, S1 normal and S2 normal. No murmur heard. Pulmonary:     Effort: Pulmonary effort is normal.     Breath sounds: Normal breath sounds. No wheezing, rhonchi or rales.     Comments: Clear to auscultation bilaterally Abdominal:     General: Bowel sounds are normal.     Palpations: Abdomen is soft.      Tenderness: There is no abdominal tenderness.  Musculoskeletal:     Cervical back:  No spinous process tenderness or muscular tenderness.     Comments: Strength 5/5 bilateral upper and lower extremities.  Neurological:     General: No focal deficit present.     Mental Status: She is alert and oriented to person, place, and time.     Cranial Nerves: Cranial nerves 2-12 are intact.     Motor: Motor function is intact.     Coordination: Coordination is intact.     Gait: Gait is intact.     Comments: Cranial nerves II through XII grossly intact.  No focal neurological defect on exam.  Psychiatric:        Behavior: Behavior is cooperative.      UC Treatments / Results  Labs (all labs ordered are listed, but only abnormal results are displayed) Labs Reviewed  POCT URINALYSIS DIP (MANUAL ENTRY) - Abnormal; Notable for the following components:      Result Value   Urobilinogen, UA 2.0 (*)    All other components within normal limits  CBC WITH DIFFERENTIAL/PLATELET  COMPREHENSIVE METABOLIC PANEL  POCT FASTING CBG [facility] MANUAL ENTRY  POCT URINE PREGNANCY    EKG   Radiology No results found.  Procedures Procedures (including critical care time)  Medications Ordered in UC Medications - No data to display  Initial Impression / Assessment and Plan / UC Course  I have reviewed the triage vital signs and the nursing notes.  Pertinent labs & imaging results that were available during my care of the patient were reviewed by me and considered in my medical decision making (see chart for details).     Patient is well-appearing, afebrile, nontoxic, nontachycardic.  Vital signs and physical exam are reassuring.  She denied any URI symptoms so viral testing was deferred.  EKG was obtained given her constellation of symptoms that showed normal sinus rhythm with ventricular rate of 74 bpm without ischemic changes; compared to 03/09/2019 tracing no significant change.  Random glucose was  normal.  UA showed no evidence of dehydration.  Urine pregnancy was negative.  Orthostatic vital signs were appropriate.  She difficulty characterizing her symptoms and described as a combination of dizziness and lightheadedness so was given meclizine to help manage her symptoms.  Also recommend she begin allergy medicine over-the-counter.  Basic blood work including CBC and CMP were obtained and are pending.  We will contact her if these results are abnormal and change our treatment plan.  Recommend close follow-up with her primary care as if her symptoms persist she may benefit from additional workup that we do not have access to an urgent care.  Recommend she rest and drink plenty of fluid.  She is to eat small frequent meals.  Discussed that if anything worsens and she has persistent lightheadedness, syncopal episodes, weakness, chest pain, shortness of breath, nausea/vomiting interfering with her intake, weakness she needs to be seen emergently.  Strict return precautions given.  She declined work excuse note.  Final Clinical Impressions(s) / UC Diagnoses   Final diagnoses:  Episodic lightheadedness  Fatigue, unspecified type  Dizziness     Discharge Instructions      So far everything looks normal.  Start meclizine 3 times a day to see if this helps your symptoms.  We will contact you if any of your blood work is abnormal.  If your symptoms are not improving I do recommend you follow-up with your primary care.  If anything worsens you need to be seen immediately including lightheadedness, passing out, weakness, nausea/vomiting interfering with  oral intake.     ED Prescriptions     Medication Sig Dispense Auth. Provider   meclizine (ANTIVERT) 25 MG tablet Take 1 tablet (25 mg total) by mouth 3 (three) times daily as needed for dizziness. 30 tablet Tanette Chauca, Noberto Retort, PA-C      PDMP not reviewed this encounter.   Jeani Hawking, PA-C 02/03/23 1613

## 2023-02-03 NOTE — ED Triage Notes (Addendum)
Patient to Urgent Care with complaints of dizziness/ fatigue/ nausea/ bilateral ears are ringing/ reports "burning" in her sinuses/ headaches. Denies any vomiting. No fevers.  Symptoms started five days ago. Reports episodes are intermittent. Denies any worsening symptoms when changing position.

## 2023-02-04 LAB — COMPREHENSIVE METABOLIC PANEL
ALT: 12 [IU]/L (ref 0–32)
AST: 11 [IU]/L (ref 0–40)
Albumin: 5 g/dL (ref 4.0–5.0)
Alkaline Phosphatase: 53 [IU]/L (ref 44–121)
BUN/Creatinine Ratio: 13 (ref 9–23)
BUN: 8 mg/dL (ref 6–20)
Bilirubin Total: 0.7 mg/dL (ref 0.0–1.2)
CO2: 16 mmol/L — ABNORMAL LOW (ref 20–29)
Calcium: 9.5 mg/dL (ref 8.7–10.2)
Chloride: 103 mmol/L (ref 96–106)
Creatinine, Ser: 0.6 mg/dL (ref 0.57–1.00)
Globulin, Total: 3 g/dL (ref 1.5–4.5)
Glucose: 98 mg/dL (ref 70–99)
Potassium: 3.8 mmol/L (ref 3.5–5.2)
Sodium: 142 mmol/L (ref 134–144)
Total Protein: 8 g/dL (ref 6.0–8.5)
eGFR: 125 mL/min/{1.73_m2} (ref >59)

## 2023-02-04 LAB — CBC WITH DIFFERENTIAL/PLATELET
Basophils Absolute: 0 10*3/uL (ref 0.0–0.2)
Basos: 0 %
EOS (ABSOLUTE): 0.2 10*3/uL (ref 0.0–0.4)
Eos: 1 %
Hematocrit: 46.5 % (ref 34.0–46.6)
Hemoglobin: 15.2 g/dL (ref 11.1–15.9)
Immature Grans (Abs): 0 10*3/uL (ref 0.0–0.1)
Immature Granulocytes: 0 %
Lymphocytes Absolute: 2.9 10*3/uL (ref 0.7–3.1)
Lymphs: 25 %
MCH: 29.6 pg (ref 26.6–33.0)
MCHC: 32.7 g/dL (ref 31.5–35.7)
MCV: 91 fL (ref 79–97)
Monocytes Absolute: 0.8 10*3/uL (ref 0.1–0.9)
Monocytes: 6 %
Neutrophils Absolute: 8 10*3/uL — ABNORMAL HIGH (ref 1.4–7.0)
Neutrophils: 68 %
Platelets: 291 10*3/uL (ref 150–450)
RBC: 5.13 x10E6/uL (ref 3.77–5.28)
RDW: 13.7 % (ref 11.7–15.4)
WBC: 11.9 10*3/uL — ABNORMAL HIGH (ref 3.4–10.8)

## 2023-02-05 ENCOUNTER — Encounter: Payer: Self-pay | Admitting: Nurse Practitioner

## 2023-02-05 ENCOUNTER — Other Ambulatory Visit: Payer: Self-pay

## 2023-02-05 ENCOUNTER — Ambulatory Visit: Payer: Self-pay

## 2023-02-05 ENCOUNTER — Ambulatory Visit (INDEPENDENT_AMBULATORY_CARE_PROVIDER_SITE_OTHER): Payer: Self-pay | Admitting: Nurse Practitioner

## 2023-02-05 VITALS — BP 126/84 | HR 90 | Temp 98.1°F | Resp 18 | Ht 62.75 in | Wt 163.9 lb

## 2023-02-05 DIAGNOSIS — E11649 Type 2 diabetes mellitus with hypoglycemia without coma: Secondary | ICD-10-CM

## 2023-02-05 DIAGNOSIS — H9313 Tinnitus, bilateral: Secondary | ICD-10-CM

## 2023-02-05 DIAGNOSIS — R42 Dizziness and giddiness: Secondary | ICD-10-CM

## 2023-02-05 NOTE — Progress Notes (Signed)
BP 126/84   Pulse 90   Temp 98.1 F (36.7 C) (Oral)   Resp 18   Ht 5' 2.75" (1.594 m)   Wt 163 lb 14.4 oz (74.3 kg)   LMP 01/19/2023   SpO2 99%   BMI 29.27 kg/m    Subjective:    Patient ID: Tara Rose, female    DOB: Nov 23, 1990, 32 y.o.   MRN: [id number]  HPI: Tara Rose is a 32 y.o. female  Chief Complaint  Patient presents with   Dizziness    Nauseated, ears ringing for 6 days   Dizziness/fatigue/tinnitus: seen in urgent care on 02/03/2023 for dizziness.  Er note: Patient presents today with a 5-day history of dizziness, fatigue, intermittent nausea, tinnitus. She denies any cough, congestion, fever. Denies any known sick contacts but did go to a party and was exposed to children about 2 to 3 days before symptoms began. She denies any recent head injury. Denies any medication changes. She does have a history of type 2 diabetes and has had hypoglycemic episodes in the past but states current symptoms are not similar previous episodes of this condition. Symptoms last for several hours and then will improve. She has not identified any significant trigger. She is eating and drinking normally. She denies any significant blood loss including melena, hematochezia, heavy menstrual cycle. She denies any change in her hearing. She denies any history of arrhythmia. Denies associated shortness of breath, palpitations, chest pain.  Patient is well-appearing, afebrile, nontoxic, nontachycardic. Vital signs and physical exam are reassuring. She denied any URI symptoms so viral testing was deferred. EKG was obtained given her constellation of symptoms that showed normal sinus rhythm with ventricular rate of 74 bpm without ischemic changes; compared to 03/09/2019 tracing no significant change. Random glucose was normal. UA showed no evidence of dehydration. Urine pregnancy was negative. Orthostatic vital signs were appropriate. She difficulty characterizing her symptoms and described as a  combination of dizziness and lightheadedness so was given meclizine to help manage her symptoms. Also recommend she begin allergy medicine over-the-counter. Basic blood work including CBC and CMP were obtained and are pending. We will contact her if these results are abnormal and change our treatment plan. Recommend close follow-up with her primary care as if her symptoms persist she may benefit from additional workup that we do not have access to an urgent care. Recommend she rest and drink plenty of fluid. She is to eat small frequent meals. Discussed that if anything worsens and she has persistent lightheadedness, syncopal episodes, weakness, chest pain, shortness of breath, nausea/vomiting interfering with her intake, weakness she needs to be seen emergently. Strict return precautions given. She declined work excuse note.  CBC and CMP were unremarkable.  Today patient reports:  she is doing better today. She reports she did not try the meclizine.  She was concerned she would not tolerate the medication. Neuro exam was within normal limits.  Recommend pushing fluids, starting zyrtec or Claritin and Flonase. Provided information for the epley maneuver.  Referral placed to ent if no improvement.   Episodes of hypoglycemia: she reports she has stopped taking her metformin and is still having low blood sugar levels.  Patient has been dealing with this for awhile.  Discussed that previously we had ordered labs but they have not been completed yet. Will get them today.  She has also been working with a dietitian and is eating every 4 hours.    Relevant past medical, surgical, family and  social history reviewed and updated as indicated. Interim medical history since our last visit reviewed. Allergies and medications reviewed and updated.  Review of Systems  Ten systems reviewed and is negative except as mentioned in HPI       Objective:    BP 126/84   Pulse 90   Temp 98.1 F (36.7 C) (Oral)   Resp  18   Ht 5' 2.75" (1.594 m)   Wt 163 lb 14.4 oz (74.3 kg)   LMP 01/19/2023   SpO2 99%   BMI 29.27 kg/m   Wt Readings from Last 3 Encounters:  02/05/23 163 lb 14.4 oz (74.3 kg)  11/24/22 165 lb 1.6 oz (74.9 kg)  11/20/22 167 lb 3.2 oz (75.8 kg)    Physical Exam  Constitutional: Patient appears well-developed and well-nourished. Obese  No distress.  HEENT: head atraumatic, normocephalic, pupils equal and reactive to light, ears TMs clear, neck supple, throat within normal limits Cardiovascular: Normal rate, regular rhythm and normal heart sounds.  No murmur heard. No BLE edema. Pulmonary/Chest: Effort normal and breath sounds normal. No respiratory distress. Abdominal: Soft.  There is no tenderness. Neuro: equal grip, stable gait,  equal sensation, no arm drift Psychiatric: Patient has a normal mood and affect. behavior is normal. Judgment and thought content normal.  Results for orders placed or performed during the hospital encounter of 02/03/23  CBC with Differential/Platelet  Result Value Ref Range   WBC 11.9 (H) 3.4 - 10.8 x10E3/uL   RBC 5.13 3.77 - 5.28 x10E6/uL   Hemoglobin 15.2 11.1 - 15.9 g/dL   Hematocrit 78.2 95.6 - 46.6 %   MCV 91 79 - 97 fL   MCH 29.6 26.6 - 33.0 pg   MCHC 32.7 31.5 - 35.7 g/dL   RDW 21.3 08.6 - 57.8 %   Platelets 291 150 - 450 x10E3/uL   Neutrophils 68 Not Estab. %   Lymphs 25 Not Estab. %   Monocytes 6 Not Estab. %   Eos 1 Not Estab. %   Basos 0 Not Estab. %   Neutrophils Absolute 8.0 (H) 1.4 - 7.0 x10E3/uL   Lymphocytes Absolute 2.9 0.7 - 3.1 x10E3/uL   Monocytes Absolute 0.8 0.1 - 0.9 x10E3/uL   EOS (ABSOLUTE) 0.2 0.0 - 0.4 x10E3/uL   Basophils Absolute 0.0 0.0 - 0.2 x10E3/uL   Immature Granulocytes 0 Not Estab. %   Immature Grans (Abs) 0.0 0.0 - 0.1 x10E3/uL  Comprehensive metabolic panel  Result Value Ref Range   Glucose 98 70 - 99 mg/dL   BUN 8 6 - 20 mg/dL   Creatinine, Ser 4.69 0.57 - 1.00 mg/dL   eGFR 629 >52 WU/XLK/4.40    BUN/Creatinine Ratio 13 9 - 23   Sodium 142 134 - 144 mmol/L   Potassium 3.8 3.5 - 5.2 mmol/L   Chloride 103 96 - 106 mmol/L   CO2 16 (L) 20 - 29 mmol/L   Calcium 9.5 8.7 - 10.2 mg/dL   Total Protein 8.0 6.0 - 8.5 g/dL   Albumin 5.0 4.0 - 5.0 g/dL   Globulin, Total 3.0 1.5 - 4.5 g/dL   Bilirubin Total 0.7 0.0 - 1.2 mg/dL   Alkaline Phosphatase 53 44 - 121 IU/L   AST 11 0 - 40 IU/L   ALT 12 0 - 32 IU/L  POCT CBG (manual entry)  Result Value Ref Range   POCT Glucose ([facility]) 90 70 - 99 mg/dL  POCT urinalysis dipstick  Result Value Ref Range   Color, UA  yellow yellow   Clarity, UA clear clear   Glucose, UA negative negative mg/dL   Bilirubin, UA negative negative   Ketones, POC UA negative negative mg/dL   Spec Grav, UA 1.610 9.604 - 1.025   Blood, UA negative negative   pH, UA 6.0 5.0 - 8.0   Protein Ur, POC negative negative mg/dL   Urobilinogen, UA 2.0 (A) 0.2 or 1.0 E.U./dL   Nitrite, UA Negative Negative   Leukocytes, UA Negative Negative  POCT urine pregnancy  Result Value Ref Range   Preg Test, Ur Negative Negative      Assessment & Plan:   Problem List Items Addressed This Visit   None Visit Diagnoses     Dizziness    -  Primary   placed referral to ent if no improvement.  can start zyrtec, or claritin and flonase, push fluids.   Relevant Orders   Ambulatory referral to ENT   Tinnitus of both ears       placed referral to ent if no improvement. can start zyrtec, or claritin and flonase, push fluids.   Relevant Orders   Ambulatory referral to ENT   Hypoglycemia associated with diabetes (HCC)       getting labs        Follow up plan: Return if symptoms worsen or fail to improve.

## 2023-02-05 NOTE — Telephone Encounter (Signed)
  Chief Complaint: dizziness, nausea, tinnitus  Symptoms: last Tuesday started with "bad" headache,  slept and woke up with dizziness/ nausea, inside nose burning. Can "smell something". Right ear ringing louder than left ear. Fatigue, freezing cold but no fever. Blood sugar 89 this am  Frequency: last Tuesday  Pertinent Negatives: Patient denies chest pain no difficulty breathing reported. No sinus congestion no cough, no fever Disposition: [] ED /[] Urgent Care (no appt availability in office) / [x] Appointment(In office/virtual)/ []  [facility]/ [] Home Care/ [] Refused Recommended Disposition /[] [facility]/ []  Follow-up with PCP Additional Notes:   Appt scheduled today  Reason for Disposition  Earache  Answer Assessment - Initial Assessment Questions 1. DESCRIPTION: "Describe your dizziness."     Lightheadedness  2. VERTIGO: "Do you feel like either you or the room is spinning or tilting?"      Some room spinning at times.  3. LIGHTHEADED: "Do you feel lightheaded?" (e.g., somewhat faint, woozy, weak upon standing)     Same sitting and standing  4. SEVERITY: "How bad is it?"  "Can you walk?"   - MILD: Feels slightly dizzy and unsteady, but is walking normally.   - MODERATE: Feels unsteady when walking, but not falling; interferes with normal activities (e.g., school, work).   - SEVERE: Unable to walk without falling, or requires assistance to walk without falling.     Mild  5. ONSET:  "When did the dizziness begin?"     Last Tuesday  6. AGGRAVATING FACTORS: "Does anything make it worse?" (e.g., standing, change in head position)     Na 7. CAUSE: "What do you think is causing the dizziness?"     Not sure  8. RECURRENT SYMPTOM: "Have you had dizziness before?" If Yes, ask: "When was the last time?" "What happened that time?"     Na  9. OTHER SYMPTOMS: "Do you have any other symptoms?" (e.g., headache, weakness, numbness, vomiting, earache)     Nausea, dizziness ,  ear ringing, right worse than left  10. PREGNANCY: "Is there any chance you are pregnant?" "When was your last menstrual period?"       na  Protocols used: Dizziness - Vertigo-A-AH

## 2023-02-05 NOTE — Telephone Encounter (Signed)
Note in my chart says to make an appt, but I was told to still send a high priority message  ----- Message from [place] B sent at 02/05/2023 12:34 PM EDT ----- Pt called in about dizziness, and nausea, pt says phone maygo straight to vm, because she is at work, but she can call right back     Called pt - left message to return our call.

## 2023-02-15 LAB — C-PEPTIDE: C-Peptide: 3.61 ng/mL (ref 0.80–3.85)

## 2023-02-15 LAB — IA-2 ANTIBODY: IA-2 Antibody: <5.4 U/mL (ref <5.4)

## 2023-02-15 LAB — INSULIN, FREE (BIOACTIVE): Insulin, Free: 14.4 u[IU]/mL (ref 1.5–14.9)

## 2023-02-15 LAB — GLUTAMIC ACID DECARBOXYLASE AUTO ABS: Glutamic Acid Decarb Ab: <5 [IU]/mL (ref <5)

## 2023-02-21 ENCOUNTER — Ambulatory Visit: Payer: PRIVATE HEALTH INSURANCE | Admitting: Nurse Practitioner

## 2023-03-23 ENCOUNTER — Ambulatory Visit: Payer: PRIVATE HEALTH INSURANCE | Admitting: Nurse Practitioner

## 2023-03-30 ENCOUNTER — Ambulatory Visit: Payer: PRIVATE HEALTH INSURANCE | Admitting: Nurse Practitioner

## 2023-04-06 ENCOUNTER — Encounter: Payer: Self-pay | Admitting: Nurse Practitioner

## 2023-04-06 NOTE — Telephone Encounter (Signed)
 Care team updated and letter sent for eye exam notes.

## 2023-04-18 NOTE — L&D Delivery Note (Signed)
         Delivery Note   Tara Rose is a 33 y.o. G1P0000 at [redacted]w[redacted]d Estimated Date of Delivery: 02/12/24  PRE-OPERATIVE DIAGNOSIS:  1) [redacted]w[redacted]d pregnancy.  GDM on Metformin     POST-OPERATIVE DIAGNOSIS:  1) [redacted]w[redacted]d pregnancy s/p Vaginal, Spontaneous   Delivery Type: Vaginal, Spontaneous   Delivery Anesthesia: Epidural  Labor Complications:  none    ESTIMATED BLOOD LOSS: 600 ml    FINDINGS:   1) female infant, Apgar scores of 9   at 1 minute and 9   at 5 minutes and a birthweight of 118.87 ounces.    2) Nuchal cord: no  SPECIMENS:   PLACENTA:   Appearance:  Intact, 3 vessel cord   Removal:  Spontaneous    Disposition: per protocol   DISPOSITION:  Infant to left in stable condition in the delivery room, with L&D personnel and mother,  NARRATIVE SUMMARY: Labor course:  Tara Rose is a G1P0000 at [redacted]w[redacted]d who presented for induction of labor.  She progressed well in labor with pitocin.  She received the appropriate anesthesia and proceeded to complete dilation. She evidenced good maternal expulsive effort during the second stage. She went on to deliver a viable female infant. The placenta delivered without problems and was noted to be complete. A perineal and vaginal examination was performed. Lacerations:  1st vaginal and perineal  Lacerations were repaired with 3-0 Vicryl rapide suture. The patient tolerated this well.  [name], CNM  01/25/2024 8:36 AM

## 2023-04-27 ENCOUNTER — Ambulatory Visit: Payer: PRIVATE HEALTH INSURANCE | Admitting: Nurse Practitioner

## 2023-05-21 ENCOUNTER — Encounter: Payer: Self-pay | Admitting: Nurse Practitioner

## 2023-05-21 ENCOUNTER — Ambulatory Visit: Payer: Self-pay | Admitting: Nurse Practitioner

## 2023-05-21 VITALS — BP 116/82 | HR 102 | Temp 97.6°F | Resp 18 | Ht 62.75 in | Wt 160.5 lb

## 2023-05-21 DIAGNOSIS — E782 Mixed hyperlipidemia: Secondary | ICD-10-CM | POA: Diagnosis not present

## 2023-05-21 DIAGNOSIS — E1165 Type 2 diabetes mellitus with hyperglycemia: Secondary | ICD-10-CM

## 2023-05-21 DIAGNOSIS — E6609 Other obesity due to excess calories: Secondary | ICD-10-CM

## 2023-05-21 DIAGNOSIS — E66811 Obesity, class 1: Secondary | ICD-10-CM | POA: Diagnosis not present

## 2023-05-21 DIAGNOSIS — Z6833 Body mass index (BMI) 33.0-33.9, adult: Secondary | ICD-10-CM | POA: Diagnosis not present

## 2023-05-21 LAB — POCT GLYCOSYLATED HEMOGLOBIN (HGB A1C): Hemoglobin A1C: 5.1 % (ref 4.0–5.6)

## 2023-05-21 NOTE — Assessment & Plan Note (Signed)
Comorbidities are a hx of hyperlipidemia, and type 2 DM. Currently managing both conditions with diet, will follow-up in 4 months

## 2023-05-21 NOTE — Assessment & Plan Note (Signed)
Pt is not currently taking any DM meds. Current plan for pt is to eat small meals every 4 hours consisting of protein and carbs. Pt will follow-up in 4 months

## 2023-05-21 NOTE — Progress Notes (Signed)
BP 116/82   Pulse (!) 102   Temp 97.6 F (36.4 C)   Resp 18   Ht 5' 2.75" (1.594 m)   Wt 160 lb 8 oz (72.8 kg)   SpO2 99%   BMI 28.66 kg/m    Subjective:    Patient ID: Tara Rose, female    DOB: 1990-11-25, 33 y.o.   MRN: [id number]  HPI: Tara Rose is a 33 y.o. female  Chief Complaint  Patient presents with   Medical Management of Chronic Issues   Diabetes  Pt returns to clinic for 3 month follow-up. Pt reports continuous episodes of symptomatic hypoglycemia in which she experiences palpitations and dizziness daily. Pt is not currently taking any mediations for DM and states that she has to eat snacks throughout the day to maintain BS levels. Pt encouraged to eat small meals every 4 hours consisting of protein and carbs. She has been seen by nutritionist.       02/05/2023    2:40 PM 11/24/2022    3:18 PM 11/20/2022    3:22 PM  Depression screen PHQ 2/9  Decreased Interest 0 0 0  Down, Depressed, Hopeless 0 0   PHQ - 2 Score 0 0 0    Relevant past medical, surgical, family and social history reviewed and updated as indicated. Interim medical history since our last visit reviewed. Allergies and medications reviewed and updated.  Constitutional: Negative for fever or weight change.  Respiratory: Negative for cough and shortness of breath.   Cardiovascular: Negative for chest pain, reports palpitations during episodes of hypoglycemia Gastrointestinal: Negative for abdominal pain, no bowel changes.  Musculoskeletal: Negative for gait problem or joint swelling.  Skin: Negative for rash.  Neurological: Reports episodes of dizziness during episodes of hypoglycemia No other specific complaints in a complete review of systems (except as listed in HPI above).       Objective:    BP 116/82   Pulse (!) 102   Temp 97.6 F (36.4 C)   Resp 18   Ht 5' 2.75" (1.594 m)   Wt 160 lb 8 oz (72.8 kg)   SpO2 99%   BMI 28.66 kg/m    Wt Readings from Last 3 Encounters:   05/21/23 160 lb 8 oz (72.8 kg)  02/05/23 163 lb 14.4 oz (74.3 kg)  11/24/22 165 lb 1.6 oz (74.9 kg)    Physical Exam Vitals reviewed.  Constitutional:      Appearance: Normal appearance.  HENT:     Head: Normocephalic.  Cardiovascular:     Rate and Rhythm: Normal rate and regular rhythm.  Pulmonary:     Effort: Pulmonary effort is normal.     Breath sounds: Normal breath sounds.  Musculoskeletal:        General: Normal range of motion.  Skin:    General: Skin is warm and dry.  Neurological:     General: No focal deficit present.     Mental Status: She is alert and oriented to person, place, and time. Mental status is at baseline.  Psychiatric:        Mood and Affect: Mood normal.        Behavior: Behavior normal.        Thought Content: Thought content normal.        Judgment: Judgment normal.    Diabetic Foot Exam - Simple   Simple Foot Form Diabetic Foot exam was performed with the following findings: Yes 05/21/2023  4:11 PM  Visual Inspection  No deformities, no ulcerations, no other skin breakdown bilaterally: Yes Sensation Testing Intact to touch and monofilament testing bilaterally: Yes Pulse Check Posterior Tibialis and Dorsalis pulse intact bilaterally: Yes Comments     Results for orders placed or performed in visit on 05/21/23  POCT HgB A1C   Collection Time: 05/21/23  3:34 PM  Result Value Ref Range   Hemoglobin A1C 5.1 4.0 - 5.6 %   HbA1c POC (<> result, manual entry)     HbA1c, POC (prediabetic range)     HbA1c, POC (controlled diabetic range)         Assessment & Plan:   Problem List Items Addressed This Visit       Endocrine   Type 2 diabetes mellitus with hyperglycemia, without long-term current use of insulin (HCC) - Primary   Pt is not currently taking any DM meds. Current plan for pt is to eat small meals every 4 hours consisting of protein and carbs. Pt will follow-up in 4 months      Relevant Orders   POCT HgB A1C (Completed)   HM  Diabetes Foot Exam (Completed)   Microalbumin / creatinine urine ratio     Other   Class 1 obesity due to excess calories without serious comorbidity with body mass index (BMI) of 33.0 to 33.9 in adult   Comorbidities are a hx of hyperlipidemia, and type 2 DM. Currently managing both conditions with diet, will follow-up in 4 months       Mixed hyperlipidemia   Pt is not currently taking medications for condition, manages condition with diet, will repeat labs in 4 months and reevaluate.         Follow up plan: Return in 4 months (on 09/18/2023) for CPE w/ pap, cpe.

## 2023-05-21 NOTE — Assessment & Plan Note (Signed)
Pt is not currently taking medications for condition, manages condition with diet, will repeat labs in 4 months and reevaluate.

## 2023-05-22 LAB — MICROALBUMIN / CREATININE URINE RATIO
Creatinine, Urine: 79 mg/dL (ref 20–275)
Microalb Creat Ratio: 9 mg/g{creat} (ref <30)
Microalb, Ur: 0.7 mg/dL

## 2023-06-11 ENCOUNTER — Ambulatory Visit: Payer: 59 | Admitting: Nurse Practitioner

## 2023-06-11 ENCOUNTER — Encounter: Payer: Self-pay | Admitting: Nurse Practitioner

## 2023-06-11 VITALS — BP 118/72 | HR 98 | Resp 16 | Ht 62.75 in | Wt 163.0 lb

## 2023-06-11 DIAGNOSIS — Z3201 Encounter for pregnancy test, result positive: Secondary | ICD-10-CM | POA: Diagnosis not present

## 2023-06-11 DIAGNOSIS — F17210 Nicotine dependence, cigarettes, uncomplicated: Secondary | ICD-10-CM | POA: Diagnosis not present

## 2023-06-11 LAB — POCT URINE PREGNANCY: Preg Test, Ur: POSITIVE — AB

## 2023-06-11 NOTE — Progress Notes (Signed)
 BP 118/72   Pulse 98   Resp 16   Ht 5' 2.75" (1.594 m)   Wt 163 lb (73.9 kg)   LMP 05/04/2023   SpO2 97%   BMI 29.10 kg/m    Subjective:    Patient ID: Tara Rose, female    DOB: [birth year], 33 y.o.   MRN: [id number]  HPI: Tara Rose is a 33 y.o. female  Chief Complaint  Patient presents with   positive pregnancy test    x3    Discussed the use of AI scribe software for clinical note transcription with the patient, who gave verbal consent to proceed.  History of Present Illness   The patient, with a past medical history of diabetes, presents today after having eight positive home pregnancy tests. This is her first pregnancy, and she reports feeling both excited and nervous. The patient has been managing her diabetes through lifestyle changes and has seen significant improvement in her condition. She is currently smoking but is making efforts to quit. She has been experiencing nausea, which she describes as tolerable.  She has not started prenatal vitamins yet but plans to do so.       02/05/2023    2:40 PM 11/24/2022    3:18 PM 11/20/2022    3:22 PM  Depression screen PHQ 2/9  Decreased Interest 0 0 0  Down, Depressed, Hopeless 0 0   PHQ - 2 Score 0 0 0    Relevant past medical, surgical, family and social history reviewed and updated as indicated. Interim medical history since our last visit reviewed. Allergies and medications reviewed and updated.  Review of Systems  Constitutional: Negative for fever or weight change.  Respiratory: Negative for cough and shortness of breath.   Cardiovascular: Negative for chest pain or palpitations.  Gastrointestinal: Negative for abdominal pain, no bowel changes.  Musculoskeletal: Negative for gait problem or joint swelling.  Skin: Negative for rash.  Neurological: Negative for dizziness or headache.  No other specific complaints in a complete review of systems (except as listed in HPI above).      Objective:    BP  118/72   Pulse 98   Resp 16   Ht 5' 2.75" (1.594 m)   Wt 163 lb (73.9 kg)   LMP 05/04/2023   SpO2 97%   BMI 29.10 kg/m    Wt Readings from Last 3 Encounters:  06/11/23 163 lb (73.9 kg)  05/21/23 160 lb 8 oz (72.8 kg)  02/05/23 163 lb 14.4 oz (74.3 kg)    Physical Exam Vitals reviewed.  Constitutional:      Appearance: Normal appearance.  HENT:     Head: Normocephalic.  Cardiovascular:     Rate and Rhythm: Normal rate and regular rhythm.  Pulmonary:     Effort: Pulmonary effort is normal.     Breath sounds: Normal breath sounds.  Musculoskeletal:        General: Normal range of motion.  Skin:    General: Skin is warm and dry.  Neurological:     General: No focal deficit present.     Mental Status: She is alert and oriented to person, place, and time. Mental status is at baseline.  Psychiatric:        Mood and Affect: Mood normal.        Behavior: Behavior normal.        Thought Content: Thought content normal.        Judgment: Judgment normal.  Results for orders placed or performed in visit on 06/11/23  POCT urine pregnancy   Collection Time: 06/11/23  2:45 PM  Result Value Ref Range   Preg Test, Ur Positive (A) Negative       Assessment & Plan:   Problem List Items Addressed This Visit   None Visit Diagnoses       Positive pregnancy test    -  Primary   Relevant Orders   POCT urine pregnancy (Completed)   CBC with Differential/Platelet   COMPLETE METABOLIC PANEL WITH GFR   Beta HCG, Quant   RPR   Hepatitis C antibody   HIV Antibody (routine testing w rflx)   Ambulatory referral to Obstetrics / Gynecology        Assessment and Plan    Pregnancy First pregnancy with last menstrual cycle on January 17th. Patient has taken multiple home pregnancy tests, all positive. Experiencing nausea, which are normal early pregnancy symptoms. -Order beta HCG to confirm pregnancy and estimate gestational age. -Start prenatal vitamins, -Advise on foods to  avoid during pregnancy (sushi, soft cheeses, deli meat, high-mercury fish). -Advise on safe medications during pregnancy -Advise on nausea management (eating crackers before getting out of bed, sour candies, ginger, taking prenatal vitamins at night).  Smoking Patient is currently smoking but has reduced frequency. Discussed the risks associated with smoking during pregnancy, including lower birth weight. -Strongly advise to quit smoking completely.   Follow-up -Refer to OB-GYN for prenatal care. -Check beta HCG results tomorrow.        Follow up plan: Return if symptoms worsen or fail to improve.

## 2023-06-12 ENCOUNTER — Encounter: Payer: Self-pay | Admitting: Nurse Practitioner

## 2023-06-12 LAB — CBC WITH DIFFERENTIAL/PLATELET
Absolute Lymphocytes: 3680 {cells}/uL (ref 850–3900)
Absolute Monocytes: 960 {cells}/uL — ABNORMAL HIGH (ref 200–950)
Basophils Absolute: 64 {cells}/uL (ref 0–200)
Basophils Relative: 0.4 %
Eosinophils Absolute: 160 {cells}/uL (ref 15–500)
Eosinophils Relative: 1 %
HCT: 44.7 % (ref 35.0–45.0)
Hemoglobin: 15 g/dL (ref 11.7–15.5)
MCH: 29.8 pg (ref 27.0–33.0)
MCHC: 33.6 g/dL (ref 32.0–36.0)
MCV: 88.9 fL (ref 80.0–100.0)
MPV: 10.8 fL (ref 7.5–12.5)
Monocytes Relative: 6 %
Neutro Abs: 11136 {cells}/uL — ABNORMAL HIGH (ref 1500–7800)
Neutrophils Relative %: 69.6 %
Platelets: 324 10*3/uL (ref 140–400)
RBC: 5.03 10*6/uL (ref 3.80–5.10)
RDW: 12.8 % (ref 11.0–15.0)
Total Lymphocyte: 23 %
WBC: 16 10*3/uL — ABNORMAL HIGH (ref 3.8–10.8)

## 2023-06-12 LAB — HCG, TOTAL, QUANTITATIVE: hCG, Beta Chain, Quant, S: 734 m[IU]/mL — ABNORMAL HIGH

## 2023-06-12 LAB — COMPLETE METABOLIC PANEL WITH GFR
AG Ratio: 1.6 (calc) (ref 1.0–2.5)
ALT: 12 U/L (ref 6–29)
AST: 9 U/L — ABNORMAL LOW (ref 10–30)
Albumin: 4.5 g/dL (ref 3.6–5.1)
Alkaline phosphatase (APISO): 41 U/L (ref 31–125)
BUN: 15 mg/dL (ref 7–25)
CO2: 24 mmol/L (ref 20–32)
Calcium: 9.7 mg/dL (ref 8.6–10.2)
Chloride: 104 mmol/L (ref 98–110)
Creat: 0.57 mg/dL (ref 0.50–0.97)
Globulin: 2.8 g/dL (ref 1.9–3.7)
Glucose, Bld: 68 mg/dL (ref 65–99)
Potassium: 4.3 mmol/L (ref 3.5–5.3)
Sodium: 139 mmol/L (ref 135–146)
Total Bilirubin: 0.5 mg/dL (ref 0.2–1.2)
Total Protein: 7.3 g/dL (ref 6.1–8.1)
eGFR: 125 mL/min/{1.73_m2} (ref >=60)

## 2023-06-12 LAB — RPR: RPR Ser Ql: NONREACTIVE

## 2023-06-12 LAB — HEPATITIS C ANTIBODY: Hepatitis C Ab: NONREACTIVE

## 2023-06-12 LAB — HIV ANTIBODY (ROUTINE TESTING W REFLEX): HIV 1&2 Ab, 4th Generation: NONREACTIVE

## 2023-06-14 ENCOUNTER — Encounter: Payer: Self-pay | Admitting: Certified Nurse Midwife

## 2023-06-18 ENCOUNTER — Other Ambulatory Visit: Payer: Self-pay | Admitting: Nurse Practitioner

## 2023-06-18 DIAGNOSIS — Z3201 Encounter for pregnancy test, result positive: Secondary | ICD-10-CM

## 2023-06-19 ENCOUNTER — Encounter: Payer: Self-pay | Admitting: Nurse Practitioner

## 2023-06-19 ENCOUNTER — Telehealth: Payer: Self-pay | Admitting: Certified Nurse Midwife

## 2023-06-19 LAB — PROGESTERONE: Progesterone: 9.3 ng/mL

## 2023-06-19 LAB — HCG, QUANTITATIVE, PREGNANCY: HCG, Total, QN: 7025 m[IU]/mL

## 2023-06-19 NOTE — Telephone Encounter (Signed)
 Contacted the patient x2, leaving voicemail due to change in scheduled appointment for 3/11 NOB intake. Attempt to reach the patient on 2/28. Attempt to reach the patient have been unsuccessful.

## 2023-06-26 ENCOUNTER — Emergency Department
Admission: EM | Admit: 2023-06-26 | Discharge: 2023-06-26 | Disposition: A | Discharge status: Home / Self Care | Attending: Emergency Medicine | Admitting: Emergency Medicine

## 2023-06-26 ENCOUNTER — Encounter: Payer: Self-pay | Admitting: Emergency Medicine

## 2023-06-26 ENCOUNTER — Other Ambulatory Visit: Payer: Self-pay

## 2023-06-26 ENCOUNTER — Ambulatory Visit: Payer: 59

## 2023-06-26 ENCOUNTER — Emergency Department

## 2023-06-26 DIAGNOSIS — O209 Hemorrhage in early pregnancy, unspecified: Secondary | ICD-10-CM | POA: Diagnosis present

## 2023-06-26 DIAGNOSIS — Z6730 Type AB blood, Rh positive: Secondary | ICD-10-CM | POA: Diagnosis not present

## 2023-06-26 DIAGNOSIS — O200 Threatened abortion: Secondary | ICD-10-CM | POA: Diagnosis not present

## 2023-06-26 DIAGNOSIS — Z3A01 Less than 8 weeks gestation of pregnancy: Secondary | ICD-10-CM | POA: Insufficient documentation

## 2023-06-26 DIAGNOSIS — O4690 Antepartum hemorrhage, unspecified, unspecified trimester: Secondary | ICD-10-CM

## 2023-06-26 LAB — URINALYSIS, ROUTINE W REFLEX MICROSCOPIC
Bacteria, UA: NONE SEEN
Bilirubin Urine: NEGATIVE
Glucose, UA: NEGATIVE mg/dL
Ketones, ur: NEGATIVE mg/dL
Leukocytes,Ua: NEGATIVE
Nitrite: NEGATIVE
Protein, ur: NEGATIVE mg/dL
Specific Gravity, Urine: 1.016 (ref 1.005–1.030)
pH: 8 (ref 5.0–8.0)

## 2023-06-26 LAB — CBC WITH DIFFERENTIAL/PLATELET
Abs Immature Granulocytes: 0.08 10*3/uL — ABNORMAL HIGH (ref 0.00–0.07)
Basophils Absolute: 0.1 10*3/uL (ref 0.0–0.1)
Basophils Relative: 0 %
Eosinophils Absolute: 0.2 10*3/uL (ref 0.0–0.5)
Eosinophils Relative: 1 %
HCT: 44.7 % (ref 36.0–46.0)
Hemoglobin: 15.6 g/dL — ABNORMAL HIGH (ref 12.0–15.0)
Immature Granulocytes: 0 %
Lymphocytes Relative: 25 %
Lymphs Abs: 4.9 10*3/uL — ABNORMAL HIGH (ref 0.7–4.0)
MCH: 30.8 pg (ref 26.0–34.0)
MCHC: 34.9 g/dL (ref 30.0–36.0)
MCV: 88.3 fL (ref 80.0–100.0)
Monocytes Absolute: 1.5 10*3/uL — ABNORMAL HIGH (ref 0.1–1.0)
Monocytes Relative: 7 %
Neutro Abs: 13.2 10*3/uL — ABNORMAL HIGH (ref 1.7–7.7)
Neutrophils Relative %: 67 %
Platelets: 287 10*3/uL (ref 150–400)
RBC: 5.06 MIL/uL (ref 3.87–5.11)
RDW: 12.9 % (ref 11.5–15.5)
WBC: 19.9 10*3/uL — ABNORMAL HIGH (ref 4.0–10.5)
nRBC: 0 % (ref 0.0–0.2)

## 2023-06-26 LAB — COMPREHENSIVE METABOLIC PANEL
ALT: 17 U/L (ref 0–44)
AST: 16 U/L (ref 15–41)
Albumin: 4.2 g/dL (ref 3.5–5.0)
Alkaline Phosphatase: 37 U/L — ABNORMAL LOW (ref 38–126)
Anion gap: 10 (ref 5–15)
BUN: 9 mg/dL (ref 6–20)
CO2: 21 mmol/L — ABNORMAL LOW (ref 22–32)
Calcium: 9 mg/dL (ref 8.9–10.3)
Chloride: 107 mmol/L (ref 98–111)
Creatinine, Ser: 0.43 mg/dL — ABNORMAL LOW (ref 0.44–1.00)
GFR, Estimated: >60 mL/min (ref >60)
Glucose, Bld: 92 mg/dL (ref 70–99)
Potassium: 3.3 mmol/L — ABNORMAL LOW (ref 3.5–5.1)
Sodium: 138 mmol/L (ref 135–145)
Total Bilirubin: 0.6 mg/dL (ref 0.0–1.2)
Total Protein: 7.7 g/dL (ref 6.5–8.1)

## 2023-06-26 LAB — ABO/RH: ABO/RH(D): AB POS

## 2023-06-26 LAB — POC URINE PREG, ED: Preg Test, Ur: NEGATIVE

## 2023-06-26 LAB — HCG, QUANTITATIVE, PREGNANCY: hCG, Beta Chain, Quant, S: 24261 m[IU]/mL — ABNORMAL HIGH

## 2023-06-26 NOTE — ED Triage Notes (Signed)
 Patient ambulatory to triage with steady gait, without difficulty or distress noted; G1, approx [redacted]wks pregnant, has 1st OB appt with [facility] in am; reports vag bleeding tonight

## 2023-06-26 NOTE — ED Provider Notes (Signed)
 [facility] Provider Note    Event Date/Time   First MD Initiated Contact with Patient 06/26/23 [phone]     (approximate)   History   Vaginal Bleeding   HPI Tara Rose is a 33 y.o. female presenting today for vaginal bleeding.  Patient states she woke up around midnight overnight with 1 episode of vaginal bleeding with the passage of a blood clot.  She has noted intermittent spotting over the past 2 weeks for which her OB/GYN team is aware.  She was post to have her first appointment tomorrow morning.  Had some mild abdominal cramping.  Currently denies any significant bleeding or abdominal cramping.  No nausea or vomiting.  Approximately 8 weeks in her pregnancy.  This is her first pregnancy.     Physical Exam   Triage Vital Signs: ED Triage Vitals  Encounter Vitals Group     BP 06/26/23 0109 139/72     Systolic BP Percentile --      Diastolic BP Percentile --      Pulse Rate 06/26/23 0109 90     Resp 06/26/23 0109 18     Temp 06/26/23 0109 98.3 F (36.8 C)     Temp Source 06/26/23 0109 Oral     SpO2 06/26/23 0109 98 %     Weight 06/26/23 0046 163 lb (73.9 kg)     Height 06/26/23 0046 5\' 1"  (1.549 m)     Head Circumference --      Peak Flow --      Pain Score 06/26/23 0046 0     Pain Loc --      Pain Education --      Exclude from Growth Chart --     Most recent vital signs: Vitals:   06/26/23 0109 06/26/23 0120  BP: 139/72   Pulse: 90   Resp: 18   Temp: 98.3 F (36.8 C) 98.3 F (36.8 C)  SpO2: 98%    Physical Exam: I have reviewed the vital signs and nursing notes. General: Awake, alert, no acute distress.  Nontoxic appearing. Head:  Atraumatic, normocephalic.   ENT:  EOM intact, PERRL. Oral mucosa is pink and moist with no lesions. Neck: Neck is supple with full range of motion, No meningeal signs. Cardiovascular:  RRR, No murmurs. Peripheral pulses palpable and equal bilaterally. Respiratory:  Symmetrical chest wall  expansion.  No rhonchi, rales, or wheezes.  Good air movement throughout.  No use of accessory muscles.   Musculoskeletal:  No cyanosis or edema. Moving extremities with full ROM Abdomen:  Soft, nontender, nondistended. Neuro:  GCS 15, moving all four extremities, interacting appropriately. Speech clear. Psych:  Calm, appropriate.   Skin:  Warm, dry, no rash.    ED Results / Procedures / Treatments   Labs (all labs ordered are listed, but only abnormal results are displayed) Labs Reviewed  CBC WITH DIFFERENTIAL/PLATELET - Abnormal; Notable for the following components:      Result Value   WBC 19.9 (*)    Hemoglobin 15.6 (*)    Neutro Abs 13.2 (*)    Lymphs Abs 4.9 (*)    Monocytes Absolute 1.5 (*)    Abs Immature Granulocytes 0.08 (*)    All other components within normal limits  COMPREHENSIVE METABOLIC PANEL - Abnormal; Notable for the following components:   Potassium 3.3 (*)    CO2 21 (*)    Creatinine, Ser 0.43 (*)    Alkaline Phosphatase 37 (*)    All  other components within normal limits  HCG, QUANTITATIVE, PREGNANCY - Abnormal; Notable for the following components:   hCG, Beta Chain, Quant, S 24,261 (*)    All other components within normal limits  URINALYSIS, ROUTINE W REFLEX MICROSCOPIC - Abnormal; Notable for the following components:   Color, Urine YELLOW (*)    APPearance CLEAR (*)    Hgb urine dipstick MODERATE (*)    All other components within normal limits  POC URINE PREG, ED  ABO/RH     EKG    RADIOLOGY Independently interpreted ultrasound showing evidence of IUP but possibly bradycardic fetal heart tones   PROCEDURES:  Critical Care performed: No  Procedures   MEDICATIONS ORDERED IN ED: Medications - No data to display   IMPRESSION / MDM / ASSESSMENT AND PLAN / ED COURSE  I reviewed the triage vital signs and the nursing notes.                              Differential diagnosis includes, but is not limited to, threatened miscarriage,  miscarriage, subchorionic hemorrhage  Patient's presentation is most consistent with acute complicated illness / injury requiring diagnostic workup.  Patient is a 33 year old female presenting today for episode of vaginal bleeding with lower abdominal cramping.  Currently asymptomatic at this time and approximately [redacted] weeks pregnant.  Laboratory workup mostly reassuring.  She does have a chronic leukocytosis which is overall not unremarkable for her.  No evidence of UTI.  No other infectious symptoms.  Will get transvaginal ultrasound to evaluate pregnancy for potential miscarriage.  Ultrasound pending at time of signout.  My interpretation shows IUP but possibly bradycardic fetal heart tones.  Signed out to oncoming provider pending formal read and potential discussion with OB/GYN.  Clinical Course as of 06/26/23 0713  Tue Jun 26, 2023  0415 ABO/RH(D): AB POS Performed at [facility], [address] [place], [place] [address]  Does not need RhoGAM [DW]    Clinical Course User Index [DW] Janith Lima, MD     FINAL CLINICAL IMPRESSION(S) / ED DIAGNOSES   Final diagnoses:  Vaginal bleeding in pregnancy  Threatened miscarriage     Rx / DC Orders   ED Discharge Orders     None        Note:  This document was prepared using Dragon voice recognition software and may include unintentional dictation errors.   Janith Lima, MD 06/26/23 [phone]

## 2023-06-26 NOTE — ED Provider Notes (Signed)
-----------------------------------------   7:13 AM on 06/26/2023 -----------------------------------------  Blood pressure 139/72, pulse 90, temperature 98.3 F (36.8 C), temperature source Oral, resp. rate 18, height 5\' 1"  (1.549 m), weight 73.9 kg, last menstrual period 05/04/2023, SpO2 98%.  Assuming care from Dr. Anner Crete.  In short, Tara Rose is a 33 y.o. female with a chief complaint of Vaginal Bleeding .  Refer to the original H&P for additional details.  The current plan of care is to follow-up OB US for vaginal bleeding in pregnancy.  ----------------------------------------- 8:52 AM on 06/26/2023 ----------------------------------------- Pelvic ultrasound shows intrauterine pregnancy with moderate size subchorionic hemorrhage and fetal heart rate of 112.  On reassessment, patient with minimal pain or bleeding and she is appropriate for discharge home with close OB/GYN follow-up.  She did cancel her appointment for today, was encouraged to have this rescheduled in about 1 week, otherwise return to the ED for new or worsening symptoms.  Patient agrees with plan.     Chesley Noon, MD 06/26/23 [phone]

## 2023-06-28 ENCOUNTER — Encounter: Payer: Self-pay | Admitting: Certified Nurse Midwife

## 2023-06-28 NOTE — Telephone Encounter (Signed)
 Can she be seen tomorrow? Or today? ER follow up miscarriage

## 2023-06-29 ENCOUNTER — Other Ambulatory Visit: Payer: Self-pay | Admitting: Certified Nurse Midwife

## 2023-06-29 DIAGNOSIS — O3680X0 Pregnancy with inconclusive fetal viability, not applicable or unspecified: Secondary | ICD-10-CM

## 2023-06-29 NOTE — Progress Notes (Signed)
 Recommend follow up ultrasound for viability approximately 1w after ED u/s, order placed.

## 2023-06-29 NOTE — Telephone Encounter (Signed)
 Called patient to offer ultrasound appointment for Monday. Left message for the patient.

## 2023-07-02 ENCOUNTER — Ambulatory Visit (INDEPENDENT_AMBULATORY_CARE_PROVIDER_SITE_OTHER): Admitting: Certified Nurse Midwife

## 2023-07-02 ENCOUNTER — Ambulatory Visit (INDEPENDENT_AMBULATORY_CARE_PROVIDER_SITE_OTHER)

## 2023-07-02 ENCOUNTER — Ambulatory Visit

## 2023-07-02 ENCOUNTER — Encounter: Payer: Self-pay | Admitting: Certified Nurse Midwife

## 2023-07-02 VITALS — BP 110/75 | HR 97 | Wt 167.8 lb

## 2023-07-02 DIAGNOSIS — Z3A01 Less than 8 weeks gestation of pregnancy: Secondary | ICD-10-CM | POA: Diagnosis not present

## 2023-07-02 DIAGNOSIS — Z3401 Encounter for supervision of normal first pregnancy, first trimester: Secondary | ICD-10-CM

## 2023-07-02 DIAGNOSIS — N912 Amenorrhea, unspecified: Secondary | ICD-10-CM | POA: Diagnosis not present

## 2023-07-02 DIAGNOSIS — O0993 Supervision of high risk pregnancy, unspecified, third trimester: Secondary | ICD-10-CM | POA: Insufficient documentation

## 2023-07-02 DIAGNOSIS — O3680X0 Pregnancy with inconclusive fetal viability, not applicable or unspecified: Secondary | ICD-10-CM

## 2023-07-02 DIAGNOSIS — O208 Other hemorrhage in early pregnancy: Secondary | ICD-10-CM

## 2023-07-02 DIAGNOSIS — Z3490 Encounter for supervision of normal pregnancy, unspecified, unspecified trimester: Secondary | ICD-10-CM | POA: Insufficient documentation

## 2023-07-02 DIAGNOSIS — Z3689 Encounter for other specified antenatal screening: Secondary | ICD-10-CM

## 2023-07-02 NOTE — Patient Instructions (Signed)
 Prenatal Classes offered through [facility] [email or web address]   Doula Website [email or web address]  First Trimester of Pregnancy  The first trimester of pregnancy starts on the first day of your last monthly period until the end of week 13. This is months 1 through 3 of pregnancy. A week after a sperm fertilizes an egg, the egg will implant into the wall of the uterus and begin to develop into a baby. Body changes during your first trimester Your body goes through many changes during pregnancy. The changes usually return to normal after your baby is born. Physical changes Your breasts may grow larger and may hurt. The area around your nipples may get darker. Your periods will stop. Your hair and nails may grow faster. You may pee more often. Health changes You may tire easily. Your gums may bleed and may be sensitive when you brush and floss. You may not feel hungry. You may have heartburn. You may throw up or feel like you may throw up. You may want to eat some foods, but not others. You may have headaches. You may have trouble pooping (constipation). Other changes Your emotions may change from day to day. You may have more dreams. Follow these instructions at home: Medicines Talk to your health care provider if you're taking medicines. Ask if the medicines are safe to take during pregnancy. Your provider may change the medicines that you take. Do not take any medicines unless told to by your provider. Take a prenatal vitamin that has at least 600 micrograms (mcg) of folic acid. Do not use herbal medicines, illegal substances, or medicines that are not approved by your provider. Eating and drinking While you're pregnant your body needs extra food for your growing baby. Talk with your provider about what to eat while pregnant. Activity Most women are able  to exercise during pregnancy. Exercises may need to change as your pregnancy goes on. Talk to your provider about your activities and exercise routines. Relieving pain and discomfort Wear a good, supportive bra if your breasts hurt. Rest with your legs raised if you have leg cramps or low back pain. Safety Wear your seatbelt at all times when you're in a car. Talk to your provider if someone hits you, hurts you, or yells at you. Talk with your provider if you're feeling sad or have thoughts of hurting yourself. Lifestyle Certain things can be harmful while you're pregnant. Follow these rules: Do not use hot tubs, steam rooms, or saunas. Do not douche. Do not use tampons or scented pads. Do not drink alcohol,smoke, vape, or use products with nicotine or tobacco in them. If you need help quitting, talk with your provider. Avoid cat litter boxes and soil used by cats. These things carry germs that can cause harm to your pregnancy and your baby. General instructions Keep all follow-up visits. It helps you and your unborn baby stay as healthy as possible. Write down your questions. Take them to your visits. Your provider will: Talk with you about your overall health. Give you advice or refer you to specialists who can help with different needs, including: Prenatal education classes. Mental health and counseling. Foods and healthy eating. Ask for help if you need help with food. Call your dentist and ask to be seen. Brush your teeth with a soft toothbrush. Floss gently. Where to find more information American Pregnancy Association: americanpregnancy.org [employer]: acog.org Office on [employer] Health: TravelLesson.ca Contact a health care provider if: You feel dizzy, faint, or  have a fever. You vomit or have watery poop (diarrhea) for 2 days or more. You have abnormal discharge or bleeding from your vagina. You have pain when you pee or your pee smells  bad. You have cramps, pain, or pressure in your belly area. Get help right away if: You have trouble breathing or chest pain. You have any kind of injury, such as from a fall or a car crash. These symptoms may be an emergency. Get help right away. Call 911. Do not wait to see if the symptoms will go away. Do not drive yourself to the hospital. This information is not intended to replace advice given to you by your health care provider. Make sure you discuss any questions you have with your health care provider. Document Revised: 01/04/2023 Document Reviewed: 08/04/2022 Elsevier Patient Education  2024 Elsevier Inc.Commonly Asked Questions During Pregnancy  Cats: A parasite can be excreted in cat feces.  To avoid exposure you need to have another person empty the little box.  If you must empty the litter box you will need to wear gloves.  Wash your hands after handling your cat.  This parasite can also be found in raw or undercooked meat so this should also be avoided.  Colds, Sore Throats, Flu: Please check your medication sheet to see what you can take for symptoms.  If your symptoms are unrelieved by these medications please call the office.  Dental Work: Most any dental work Agricultural consultant recommends is permitted.  X-rays should only be taken during the first trimester if absolutely necessary.  Your abdomen should be shielded with a lead apron during all x-rays.  Please notify your provider prior to receiving any x-rays.  Novocaine is fine; gas is not recommended.  If your dentist requires a note from [place] prior to dental work please call the office and we will provide one for you.  Exercise: Exercise is an important part of staying healthy during your pregnancy.  You may continue most exercises you were accustomed to prior to pregnancy.  Later in your pregnancy you will most likely notice you have difficulty with activities requiring balance like riding a bicycle.  It is important that you listen  to your body and avoid activities that put you at a higher risk of falling.  Adequate rest and staying well hydrated are a must!  If you have questions about the safety of specific activities ask your provider.    Exposure to Children with illness: Try to avoid obvious exposure; report any symptoms to [place] when noted,  If you have chicken pos, red measles or mumps, you should be immune to these diseases.   Please do not take any vaccines while pregnant unless you have checked with your OB provider.  Fetal Movement: After 28 weeks we recommend you do "kick counts" twice daily.  Lie or sit down in a calm quiet environment and count your baby movements "kicks".  You should feel your baby at least 10 times per hour.  If you have not felt 10 kicks within the first hour get up, walk around and have something sweet to eat or drink then repeat for an additional hour.  If count remains less than 10 per hour notify your provider.  Fumigating: Follow your pest control agent's advice as to how long to stay out of your home.  Ventilate the area well before re-entering.  Hemorrhoids:   Most over-the-counter preparations can be used during pregnancy.  Check your medication to see  what is safe to use.  It is important to use a stool softener or fiber in your diet and to drink lots of liquids.  If hemorrhoids seem to be getting worse please call the office.   Hot Tubs:  Hot tubs Jacuzzis and saunas are not recommended while pregnant.  These increase your internal body temperature and should be avoided.  Intercourse:  Sexual intercourse is safe during pregnancy as long as you are comfortable, unless otherwise advised by your provider.  Spotting may occur after intercourse; report any bright red bleeding that is heavier than spotting.  Labor:  If you know that you are in labor, please go to the hospital.  If you are unsure, please call the office and let us help you decide what to do.  Lifting, straining, etc:  If your  job requires heavy lifting or straining please check with your provider for any limitations.  Generally, you should not lift items heavier than that you can lift simply with your hands and arms (no back muscles)  Painting:  Paint fumes do not harm your pregnancy, but may make you ill and should be avoided if possible.  Latex or water based paints have less odor than oils.  Use adequate ventilation while painting.  Permanents & Hair Color:  Chemicals in hair dyes are not recommended as they cause increase hair dryness which can increase hair loss during pregnancy.  " Highlighting" and permanents are allowed.  Dye may be absorbed differently and permanents may not hold as well during pregnancy.  Sunbathing:  Use a sunscreen, as skin burns easily during pregnancy.  Drink plenty of fluids; avoid over heating.  Tanning Beds:  Because their possible side effects are still unknown, tanning beds are not recommended.  Ultrasound Scans:  Routine ultrasounds are performed at approximately 20 weeks.  You will be able to see your baby's general anatomy an if you would like to know the gender this can usually be determined as well.  If it is questionable when you conceived you may also receive an ultrasound early in your pregnancy for dating purposes.  Otherwise ultrasound exams are not routinely performed unless there is a medical necessity.  Although you can request a scan we ask that you pay for it when conducted because insurance does not cover " patient request" scans.  Work: If your pregnancy proceeds without complications you may work until your due date, unless your physician or employer advises otherwise.  Round Ligament Pain/Pelvic Discomfort:  Sharp, shooting pains not associated with bleeding are fairly common, usually occurring in the second trimester of pregnancy.  They tend to be worse when standing up or when you remain standing for long periods of time.  These are the result of pressure of certain  pelvic ligaments called "round ligaments".  Rest, Tylenol and heat seem to be the most effective relief.  As the womb and fetus grow, they rise out of the pelvis and the discomfort improves.  Please notify the office if your pain seems different than that described.  It may represent a more serious condition.  Common Medications Safe in Pregnancy  Acne:      Constipation:  Benzoyl Peroxide     Colace  Clindamycin      Dulcolax Suppository  Topica Erythromycin     Fibercon  Salicylic Acid      Metamucil         Miralax AVOID:        Senakot   Accutane  Cough:  Retin-A       Cough Drops  Tetracycline      Phenergan w/ Codeine if Rx  Minocycline      Robitussin (Plain & DM)  Antibiotics:     Crabs/Lice:  Ceclor       RID  Cephalosporins    AVOID:  E-Mycins      Kwell  Keflex  Macrobid/Macrodantin   Diarrhea:  Penicillin      Kao-Pectate  Zithromax      Imodium AD         PUSH FLUIDS AVOID:       Cipro     Fever:  Tetracycline      Tylenol (Regular or Extra  Minocycline       Strength)  Levaquin      Extra Strength-Do not          Exceed 8 tabs/24 hrs Caffeine:        200mg /day (equiv. To 1 cup of coffee or  approx. 3 12 oz sodas)         Gas: Cold/Hayfever:       Gas-X  Benadryl      Mylicon  Claritin       Phazyme  **Claritin-D        Chlor-Trimeton    Headaches:  Dimetapp      ASA-Free Excedrin  Drixoral-Non-Drowsy     Cold Compress  Mucinex (Guaifenasin)     Tylenol (Regular or Extra  Sudafed/Sudafed-12 Hour     Strength)  **Sudafed PE Pseudoephedrine   Tylenol Cold & Sinus     Vicks Vapor Rub  Zyrtec  **AVOID if Problems With Blood Pressure         Heartburn: Avoid lying down for at least 1 hour after meals  Aciphex      Maalox     Rash:  Milk of Magnesia     Benadryl    Mylanta       1% Hydrocortisone Cream  Pepcid  Pepcid Complete   Sleep Aids:  Prevacid      Ambien   Prilosec       Benadryl  Rolaids       Chamomile Tea  Tums (Limit  4/day)     Unisom         Tylenol PM         Warm milk-add vanilla or  Hemorrhoids:       Sugar for taste  Anusol/Anusol H.C.  (RX: Analapram 2.5%)  Sugar Substitutes:  Hydrocortisone OTC     Ok in moderation  Preparation H      Tucks        Vaseline lotion applied to tissue with wiping    Herpes:     Throat:  Acyclovir      Oragel  Famvir  Valtrex     Vaccines:         Flu Shot Leg Cramps:       *Gardasil  Benadryl      Hepatitis A         Hepatitis B Nasal Spray:       Pneumovax  Saline Nasal Spray     Polio Booster         Tetanus Nausea:       Tuberculosis test or PPD  Vitamin B6 25 mg TID   AVOID:    Dramamine      *Gardasil  Emetrol       Live Poliovirus  Ginger Root 250 mg  QID    MMR (measles, mumps &  High Complex Carbs @ Bedtime    rebella)  Sea Bands-Accupressure    Varicella (Chickenpox)  Unisom 1/2 tab TID     *No known complications           If received before Pain:         Known pregnancy;   Darvocet       Resume series after  Lortab        Delivery  Percocet    Yeast:   Tramadol      Femstat  Tylenol 3      Gyne-lotrimin  Ultram       Monistat  Vicodin           MISC:         All Sunscreens           Hair Coloring/highlights          Insect Repellant's          (Including DEET)         Mystic Tans

## 2023-07-02 NOTE — Progress Notes (Signed)
 PREGNANCY CONFIRMATION VISIT Patient [name] MRN [id number]  Date of birth: 07-26-1990 Chief Complaint:   ER Follow Up  History of Present Illness:   Tara Rose is a 33 y.o. G5P0000 female at [redacted]w[redacted]d by certain LMP of Patient's last menstrual period was 05/04/2023 (exact date). Here for pregnancy confirmation.  Home pregnancy test: positive x8.  She reports  she continues to have small amount of dark brown spotting since her ED visit. .  She is taking prenatal vitamins.  OB History  Gravida Para Term Preterm AB Living  1 0 0 0 0 0  SAB IAB Ectopic Multiple Live Births  0 0 0 0 0    # Outcome Date GA Lbr Len/2nd Weight Sex Type Anes PTL Lv  1 Current                02/05/2023    2:40 PM 11/24/2022    3:18 PM 11/20/2022    3:22 PM 07/19/2022    3:34 PM 04/20/2022   11:07 AM  Depression screen PHQ 2/9  Decreased Interest 0 0 0 0 0  Down, Depressed, Hopeless 0 0  0 0  PHQ - 2 Score 0 0 0 0 0        05/21/2023    3:20 PM  GAD 7 : Generalized Anxiety Score  Nervous, Anxious, on Edge 0  Control/stop worrying 0  Worry too much - different things 0  Trouble relaxing 0  Restless 0  Easily annoyed or irritable 0  Afraid - awful might happen 0  Total GAD 7 Score 0  Anxiety Difficulty Not difficult at all      Review of Systems:   Pertinent items are noted in HPI Review of Systems  Constitutional:  Positive for malaise/fatigue.  Respiratory: Negative.    Cardiovascular: Negative.   Genitourinary: Negative.    Pertinent History Reviewed:  Reviewed past medical,surgical, social, obstetrical and family history.  Reviewed problem list, medications and allergies. Physical Assessment:   Vitals:   07/02/23 0854  BP: 110/75  Pulse: 97  Weight: 167 lb 12.8 oz (76.1 kg)  Body mass index is 31.71 kg/m.  Physical Exam Constitutional:      Appearance: Normal appearance.  Cardiovascular:     Rate and Rhythm: Normal rate.  Pulmonary:     Effort: Pulmonary effort  is normal.  Neurological:     General: No focal deficit present.     Mental Status: She is alert and oriented to person, place, and time.  Psychiatric:        Mood and Affect: Mood normal.        Behavior: Behavior normal.     No results found for this or any previous visit (from the past 24 hours).  Assessment & Plan:  1) [redacted]w[redacted]d pregnant by certain LMP> ultrasound today for follow up from ED demonstrates viable pregnancy, [redacted]w[redacted]d by ultrasound Prenatal vitamins: continue   Nausea medicines: not currently needed   OB packet given: No Safe medications, food safety, usual early prenatal care reviewed. Has stopped smoking! Reassurance offered that subchorionic hemorrhage is smaller and that she is no longer having bright red bleeding, may continued to see dark brown spotting/discharge with straining with BM or increased activity.  Meds: No orders of the defined types were placed in this encounter.   No orders of the defined types were placed in this encounter.   No follow-ups on file.  Dominica Severin, CNM 07/02/2023 12:09 PM

## 2023-07-02 NOTE — Progress Notes (Signed)
 New OB Intake  I discussed the limitations, risks, security and privacy concerns of performing an evaluation and management service by telephone and the availability of in person appointments. I also discussed with the patient that there may be a patient responsible charge related to this service. The patient expressed understanding and agreed to proceed.  I explained I am completing New OB Intake today. We discussed her EDD of 02/12/24 that is based on dating scan on 07/02/23. Pt is G1/P0. I reviewed her allergies, medications, Medical/Surgical/OB history, and appropriate screenings. There are cats in the home in the home; yes Indoor; aware to not clean litter box. Based on history, this is a/an pregnancy uncomplicated . Her obstetrical history is significant for none.  Patient Active Problem List   Diagnosis Date Noted   Supervision of low-risk pregnancy 07/02/2023   Leucocytosis 11/24/2022   Mixed hyperlipidemia 11/20/2022   Class 1 obesity due to excess calories without serious comorbidity with body mass index (BMI) of 33.0 to 33.9 in adult 04/19/2022   Irregular menses 11/06/2018    Concerns addressed today  Delivery Plans:  Plans to [facility] at [facility]  Dating [place] Dating [place] completed today.  Labs Discussed genetic screening with patient. Patient would like genetic testing to be drawn at new OB visit. Discussed possible labs to be drawn at new OB appointment.  COVID Vaccine Patient has not had COVID vaccine.   Social Determinants of Health Food Insecurity: denies food insecurity WIC Referral: Patient is not interested in referral to [facility].  Transportation: Patient denies transportation needs. Childcare: Discussed no children allowed at ultrasound appointments.   First visit review I reviewed new OB appt with pt. I explained she will have ob bloodwork and pap smear/pelvic exam if indicated. Explained pt will be seen by Hartley Barefoot, CNM at first visit;  encounter routed to appropriate provider.   Deno Etienne Yani Lal, CMA 07/02/2023  10:00 AM

## 2023-07-10 ENCOUNTER — Other Ambulatory Visit: Payer: 59

## 2023-07-17 ENCOUNTER — Encounter: Payer: 59 | Admitting: Certified Nurse Midwife

## 2023-07-31 ENCOUNTER — Ambulatory Visit (INDEPENDENT_AMBULATORY_CARE_PROVIDER_SITE_OTHER): Admitting: Certified Nurse Midwife

## 2023-07-31 ENCOUNTER — Other Ambulatory Visit (HOSPITAL_COMMUNITY)
Admission: RE | Admit: 2023-07-31 | Discharge: 2023-07-31 | Disposition: A | Discharge status: Home / Self Care | Source: Ambulatory Visit | Attending: Certified Nurse Midwife | Admitting: Certified Nurse Midwife

## 2023-07-31 VITALS — BP 130/78 | HR 90 | Wt 175.9 lb

## 2023-07-31 DIAGNOSIS — Z3491 Encounter for supervision of normal pregnancy, unspecified, first trimester: Secondary | ICD-10-CM

## 2023-07-31 DIAGNOSIS — Z369 Encounter for antenatal screening, unspecified: Secondary | ICD-10-CM | POA: Diagnosis present

## 2023-07-31 DIAGNOSIS — Z1329 Encounter for screening for other suspected endocrine disorder: Secondary | ICD-10-CM | POA: Diagnosis not present

## 2023-07-31 DIAGNOSIS — Z1151 Encounter for screening for human papillomavirus (HPV): Secondary | ICD-10-CM

## 2023-07-31 DIAGNOSIS — Z114 Encounter for screening for human immunodeficiency virus [HIV]: Secondary | ICD-10-CM | POA: Diagnosis not present

## 2023-07-31 DIAGNOSIS — Z124 Encounter for screening for malignant neoplasm of cervix: Secondary | ICD-10-CM | POA: Insufficient documentation

## 2023-07-31 DIAGNOSIS — Z113 Encounter for screening for infections with a predominantly sexual mode of transmission: Secondary | ICD-10-CM | POA: Diagnosis not present

## 2023-07-31 DIAGNOSIS — Z131 Encounter for screening for diabetes mellitus: Secondary | ICD-10-CM | POA: Diagnosis not present

## 2023-07-31 DIAGNOSIS — Z3A12 12 weeks gestation of pregnancy: Secondary | ICD-10-CM

## 2023-07-31 DIAGNOSIS — Z1379 Encounter for other screening for genetic and chromosomal anomalies: Secondary | ICD-10-CM | POA: Insufficient documentation

## 2023-07-31 DIAGNOSIS — Z3490 Encounter for supervision of normal pregnancy, unspecified, unspecified trimester: Secondary | ICD-10-CM | POA: Diagnosis present

## 2023-07-31 NOTE — Patient Instructions (Signed)

## 2023-07-31 NOTE — Progress Notes (Unsigned)
 NEW OB HISTORY AND PHYSICAL  SUBJECTIVE:       Tara Rose is a 33 y.o. G42P0000 female, Patient's last menstrual period was 05/04/2023 (approximate)., Estimated Date of Delivery: 02/12/24, [redacted]w[redacted]d, presents today for establishment of Prenatal Care. She reports {:313260}   Social history Partner/Relationship: Living situation: Work: Exercise: Substance use:  Indications for ASA therapy (per uptodate) One of the following: Previous pregnancy with preeclampsia, especially early onset and with an adverse outcome {yes/no:20286} Multifetal gestation {yes/no:20286} Chronic hypertension {yes/no:20286} Type 1 or 2 diabetes mellitus {yes/no:20286} Chronic kidney disease {yes/no:20286} Autoimmune disease (antiphospholipid syndrome, systemic lupus erythematosus) {yes/no:20286}  Two or more of the following: Nulliparity {yes/no:20286} Obesity (body mass index >30 kg/m2) {yes/no:20286} Family history of preeclampsia in mother or sister {yes/no:20286} Age >=35 years {yes/no:20286} Sociodemographic characteristics (African American race, low socioeconomic level) {yes/no:20286} Personal risk factors (eg, previous pregnancy with low birth weight or small for gestational age infant, previous adverse pregnancy outcome [eg, stillbirth], interval >10 years between pregnancies) {yes/no:20286}   Gynecologic History Patient's last menstrual period was 05/04/2023 (approximate). {Blank multiple:19196:: "Normal","Abnormal","Unknown"} Contraception: {method:5051} Last Pap: ***. Results were: {norm/abn:16337}  Obstetric History OB History  Gravida Para Term Preterm AB Living  1 0 0 0 0 0  SAB IAB Ectopic Multiple Live Births  0 0 0 0 0    # Outcome Date GA Lbr Len/2nd Weight Sex Type Anes PTL Lv  1 Current             Past Medical History:  Diagnosis Date  . Type 2 diabetes mellitus (HCC)     Past Surgical History:  Procedure Laterality Date  . WISDOM TOOTH EXTRACTION      Current  Outpatient Medications on File Prior to Visit  Medication Sig Dispense Refill  . Prenatal Vit-Fe Fumarate-FA (PRENATAL PO) Take by mouth.     No current facility-administered medications on file prior to visit.    Allergies  Allergen Reactions  . Lactose Intolerance (Gi) Diarrhea    Social History   Socioeconomic History  . Marital status: Married    Spouse name: Seku  . Number of children: 0  . Years of education: Not on file  . Highest education level: Bachelor's degree (e.g., BA, AB, BS)  Occupational History  . Occupation: Tree surgeon: [employer]  Tobacco Use  . Smoking status: Former    Current packs/day: 0.00    Types: Cigarettes    Quit date: 03/2022    Years since quitting: 1.3  . Smokeless tobacco: Never  Vaping Use  . Vaping status: Never Used  Substance and Sexual Activity  . Alcohol use: Never  . Drug use: Never  . Sexual activity: Yes    Birth control/protection: None  Other Topics Concern  . Not on file  Social History Narrative  . Not on file   Social Drivers of Health   Financial Resource Strain: High Risk (05/20/2023)   Overall Financial Resource Strain (CARDIA)   . Difficulty of Paying Living Expenses: Hard  Food Insecurity: Food Insecurity Present (05/20/2023)   Hunger Vital Sign   . Worried About Programme researcher, broadcasting/film/video in the Last Year: Never true   . Ran Out of Food in the Last Year: Sometimes true  Transportation Needs: No Transportation Needs (05/20/2023)   PRAPARE - Transportation   . Lack of Transportation (Medical): No   . Lack of Transportation (Non-Medical): No  Physical Activity: Inactive (05/20/2023)   Exercise Vital Sign   . Days  of Exercise per Week: 1 day   . Minutes of Exercise per Session: 0 min  Stress: Stress Concern Present (05/20/2023)   [employer] of Occupational Health - Occupational Stress Questionnaire   . Feeling of Stress : Rather much  Social Connections: Moderately Integrated (05/20/2023)   Social  Connection and Isolation Panel [NHANES]   . Frequency of Communication with Friends and Family: More than three times a week   . Frequency of Social Gatherings with Friends and Family: Once a week   . Attends Religious Services: Never   . Active Member of Clubs or Organizations: Yes   . Attends Banker Meetings: Never   . Marital Status: Married  Catering manager Violence: Not At Risk (11/24/2022)   Humiliation, Afraid, Rape, and Kick questionnaire   . Fear of Current or Ex-Partner: No   . Emotionally Abused: No   . Physically Abused: No   . Sexually Abused: No    Family History  Problem Relation Age of Onset  . Heart disease Father   . Hypertension Father   . Stroke Father   . Hypertension Sister   . Seizures Brother   . Atrial fibrillation Maternal Uncle   . Cancer Maternal Grandmother   . Cancer Maternal Grandfather     The following portions of the patient's history were reviewed and updated as appropriate: allergies, current medications, past OB history, past medical history, past surgical history, past family history, past social history, and problem list.  Constitutional: Denied constitutional symptoms, night sweats, recent illness, fatigue, fever, insomnia and weight loss.  Eyes: Denied eye symptoms, eye pain, photophobia, vision change and visual disturbance.  Ears/Nose/Throat/Neck: Denied ear, nose, throat or neck symptoms, hearing loss, nasal discharge, sinus congestion and sore throat.  Cardiovascular: Denied cardiovascular symptoms, arrhythmia, chest pain/pressure, edema, exercise intolerance, orthopnea and palpitations.  Respiratory: Denied pulmonary symptoms, asthma, pleuritic pain, productive sputum, cough, dyspnea and wheezing.  Gastrointestinal: Denied gastro-esophageal reflux, melena, nausea and vomiting.  Genitourinary:*** Denied genitourinary symptoms including symptomatic vaginal discharge, pelvic relaxation issues, and urinary complaints.   Musculoskeletal: Denied musculoskeletal symptoms, stiffness, swelling, muscle weakness and myalgia.  Dermatologic: Denied dermatology symptoms, rash and scar.  Neurologic: Denied neurology symptoms, dizziness, headache, neck pain and syncope.  Psychiatric: Denied psychiatric symptoms, anxiety and depression.  Endocrine: Denied endocrine symptoms including hot flashes and night sweats.     OBJECTIVE: Initial Physical Exam (New OB)  GENERAL APPEARANCE: {appearance:314449::"alert, well appearing"} HEAD: {head:313264::"normocephalic, atraumatic"} MOUTH: {pe mouth simple ob:314450::"mucous membranes moist, pharynx normal without lesions"} THYROID: {pe neck ob:312768::"no thyromegaly or masses present"} BREASTS: {pe breast simple:312769::"no masses noted, no significant tenderness, no palpable axillary nodes, no skin changes"} LUNGS: {pe lungs ob:314451::"clear to auscultation, no wheezes, rales or rhonchi, symmetric air entry"} HEART: {pe heart brief:310035::"regular rate and rhythm","no murmurs"} ABDOMEN: {pe abdomen pregnant simple ob:313266::"soft, nontender, nondistended, no abnormal masses, no epigastric pain"} EXTREMITIES: {pe extremities ob:314458::"no redness or tenderness in the calves or thighs"} SKIN: {pe skin brief ob:314459::"normal coloration and turgor, no rashes"} LYMPH NODES: {pe lymph nodes brief:314538::"no adenopathy palpable"} NEUROLOGIC: {pe neurologic exam ob:312789::"alert, oriented, normal speech, no focal findings or movement disorder noted"}  PELVIC EXAM {pe pelvic exam prenatal obgyn:314539}  ASSESSMENT: {pregnancy state assessment:313271::"Normal pregnancy"}   PLAN: Routine prenatal care. We discussed an overview of prenatal care and when to call. Reviewed diet, exercise, and weight gain recommendations in pregnancy. Discussed benefits of breastfeeding and lactation resources at [facility]. I reviewed labs and answered all questions.  There are no diagnoses linked  to this encounter.  Forestine Igo, CNM

## 2023-08-02 ENCOUNTER — Encounter: Payer: Self-pay | Admitting: Certified Nurse Midwife

## 2023-08-02 ENCOUNTER — Other Ambulatory Visit: Payer: Self-pay | Admitting: Certified Nurse Midwife

## 2023-08-02 LAB — CBC/D/PLT+RPR+RH+ABO+RUBIGG...
Antibody Screen: NEGATIVE
Basophils Absolute: 0.1 10*3/uL (ref 0.0–0.2)
Basos: 0 %
EOS (ABSOLUTE): 0.2 10*3/uL (ref 0.0–0.4)
Eos: 1 %
HCV Ab: NONREACTIVE
HIV Screen 4th Generation wRfx: NONREACTIVE
Hematocrit: 41.2 % (ref 34.0–46.6)
Hemoglobin: 13.7 g/dL (ref 11.1–15.9)
Hepatitis B Surface Ag: NEGATIVE
Immature Grans (Abs): 0.1 10*3/uL (ref 0.0–0.1)
Immature Granulocytes: 1 %
Lymphocytes Absolute: 3.1 10*3/uL (ref 0.7–3.1)
Lymphs: 22 %
MCH: 30.7 pg (ref 26.6–33.0)
MCHC: 33.3 g/dL (ref 31.5–35.7)
MCV: 92 fL (ref 79–97)
Monocytes Absolute: 1 10*3/uL — ABNORMAL HIGH (ref 0.1–0.9)
Monocytes: 7 %
Neutrophils Absolute: 9.8 10*3/uL — ABNORMAL HIGH (ref 1.4–7.0)
Neutrophils: 69 %
Platelets: 238 10*3/uL (ref 150–450)
RBC: 4.46 x10E6/uL (ref 3.77–5.28)
RDW: 13.1 % (ref 11.7–15.4)
RPR Ser Ql: NONREACTIVE
Rh Factor: POSITIVE
Rubella Antibodies, IGG: <0.9 {index} — ABNORMAL LOW (ref >0.99)
Varicella zoster IgG: REACTIVE
WBC: 14.1 10*3/uL — ABNORMAL HIGH (ref 3.4–10.8)

## 2023-08-02 LAB — MONITOR DRUG PROFILE 14(MW)
Amphetamine Scrn, Ur: NEGATIVE ng/mL
BARBITURATE SCREEN URINE: NEGATIVE ng/mL
BENZODIAZEPINE SCREEN, URINE: NEGATIVE ng/mL
Buprenorphine, Urine: NEGATIVE ng/mL
CANNABINOIDS UR QL SCN: NEGATIVE ng/mL
Cocaine (Metab) Scrn, Ur: NEGATIVE ng/mL
Creatinine(Crt), U: 116.1 mg/dL (ref 20.0–300.0)
Fentanyl, Urine: NEGATIVE pg/mL
Meperidine Screen, Urine: NEGATIVE ng/mL
Methadone Screen, Urine: NEGATIVE ng/mL
OXYCODONE+OXYMORPHONE UR QL SCN: NEGATIVE ng/mL
Opiate Scrn, Ur: NEGATIVE ng/mL
Ph of Urine: 6.3 (ref 4.5–8.9)
Phencyclidine Qn, Ur: NEGATIVE ng/mL
Propoxyphene Scrn, Ur: NEGATIVE ng/mL
SPECIFIC GRAVITY: 1.031
Tramadol Screen, Urine: NEGATIVE ng/mL

## 2023-08-02 LAB — PROTEIN / CREATININE RATIO, URINE
Creatinine, Urine: 112.8 mg/dL
Protein, Ur: 12.2 mg/dL
Protein/Creat Ratio: 108 mg/g{creat} (ref 0–200)

## 2023-08-02 LAB — URINALYSIS, ROUTINE W REFLEX MICROSCOPIC
Bilirubin, UA: NEGATIVE
Glucose, UA: NEGATIVE
Ketones, UA: NEGATIVE
Leukocytes,UA: NEGATIVE
Nitrite, UA: NEGATIVE
RBC, UA: NEGATIVE
Specific Gravity, UA: 1.027 (ref 1.005–1.030)
Urobilinogen, Ur: 1 mg/dL (ref 0.2–1.0)
pH, UA: 6.5 (ref 5.0–7.5)

## 2023-08-02 LAB — HCV INTERPRETATION

## 2023-08-02 LAB — CERVICOVAGINAL ANCILLARY ONLY
Chlamydia: NEGATIVE
Comment: NEGATIVE
Comment: NORMAL
Neisseria Gonorrhea: NEGATIVE

## 2023-08-02 LAB — COMPREHENSIVE METABOLIC PANEL WITH GFR
ALT: 23 IU/L (ref 0–32)
AST: 19 IU/L (ref 0–40)
Albumin: 4.5 g/dL (ref 4.0–5.0)
Alkaline Phosphatase: 40 IU/L — ABNORMAL LOW (ref 44–121)
BUN/Creatinine Ratio: 18 (ref 9–23)
BUN: 9 mg/dL (ref 6–20)
Bilirubin Total: 0.5 mg/dL (ref 0.0–1.2)
CO2: 20 mmol/L (ref 20–29)
Calcium: 9.5 mg/dL (ref 8.7–10.2)
Chloride: 103 mmol/L (ref 96–106)
Creatinine, Ser: 0.49 mg/dL — ABNORMAL LOW (ref 0.57–1.00)
Globulin, Total: 2.5 g/dL (ref 1.5–4.5)
Glucose: 84 mg/dL (ref 70–99)
Potassium: 4.2 mmol/L (ref 3.5–5.2)
Sodium: 138 mmol/L (ref 134–144)
Total Protein: 7 g/dL (ref 6.0–8.5)
eGFR: 130 mL/min/{1.73_m2} (ref >59)

## 2023-08-02 LAB — HEMOGLOBIN A1C
Est. average glucose Bld gHb Est-mCnc: 103 mg/dL
Hgb A1c MFr Bld: 5.2 % (ref 4.8–5.6)

## 2023-08-02 LAB — HGB FRACTIONATION CASCADE
Hgb A2: 2.5 % (ref 1.8–3.2)
Hgb A: 97.5 % (ref 96.4–98.8)
Hgb F: 0 % (ref 0.0–2.0)
Hgb S: 0 %

## 2023-08-02 LAB — NICOTINE SCREEN, URINE: Cotinine Ql Scrn, Ur: NEGATIVE ng/mL

## 2023-08-02 LAB — CULTURE, OB URINE

## 2023-08-02 LAB — TSH+FREE T4
Free T4: 0.97 ng/dL (ref 0.82–1.77)
TSH: 1.54 u[IU]/mL (ref 0.450–4.500)

## 2023-08-02 LAB — URINE CULTURE, OB REFLEX

## 2023-08-02 MED ORDER — ACCU-CHEK GUIDE TEST VI STRP
ORAL_STRIP | 6 refills | Status: DC
Start: 2023-08-02 — End: 2023-08-02

## 2023-08-02 NOTE — Telephone Encounter (Signed)
 Addressing in separate mychart message.

## 2023-08-03 LAB — CYTOLOGY - PAP
Comment: NEGATIVE
Diagnosis: NEGATIVE
High risk HPV: NEGATIVE

## 2023-08-05 LAB — MATERNIT 21 PLUS CORE, BLOOD
Fetal Fraction: 16
Result (T21): NEGATIVE
Trisomy 13 (Patau syndrome): NEGATIVE
Trisomy 18 (Edwards syndrome): NEGATIVE
Trisomy 21 (Down syndrome): NEGATIVE

## 2023-08-16 ENCOUNTER — Emergency Department: Admission: EM | Admit: 2023-08-16 | Discharge: 2023-08-17 | Disposition: A | Discharge status: Home / Self Care

## 2023-08-16 ENCOUNTER — Other Ambulatory Visit: Payer: Self-pay

## 2023-08-16 DIAGNOSIS — J358 Other chronic diseases of tonsils and adenoids: Secondary | ICD-10-CM | POA: Diagnosis not present

## 2023-08-16 DIAGNOSIS — J029 Acute pharyngitis, unspecified: Secondary | ICD-10-CM | POA: Diagnosis not present

## 2023-08-16 DIAGNOSIS — Q388 Other congenital malformations of pharynx: Secondary | ICD-10-CM | POA: Diagnosis not present

## 2023-08-16 LAB — GROUP A STREP BY PCR: Group A Strep by PCR: NOT DETECTED

## 2023-08-16 NOTE — ED Triage Notes (Signed)
 Pt to ED via pov c/o throat discomfort. Pt rports feeling something weird in her throat. Pt with some tonsillar inflammation. Pt able to swallow and speak in complete sentences

## 2023-08-16 NOTE — ED Provider Notes (Signed)
 [facility] Provider Note    None    (approximate)   History   Sore Throat    HPI  Tara Rose is a 33 y.o. female   [redacted] weeks pregnant, with no significant past medical history who presents to the ED complaining of sensation of foreign body on the left side of her throat.    According to the patient, she was taking a shower, started gargling with the water and felt something sitting on her throat.  Patient denies fever, cough, nasal congestion, sore throat, frequently breathing, stridor.  Patient denies any vaginal bleeding.  Patient is taking baby aspirin to prevent preeclampsia ordered by her OB/GYN.      Physical Exam   Triage Vital Signs: ED Triage Vitals  Encounter Vitals Group     BP 08/16/23 2217 127/81     Systolic BP Percentile --      Diastolic BP Percentile --      Pulse Rate 08/16/23 2217 (!) 105     Resp 08/16/23 2217 16     Temp 08/16/23 2217 98.3 F (36.8 C)     Temp src --      SpO2 08/16/23 2217 100 %     Weight 08/16/23 2215 175 lb (79.4 kg)     Height 08/16/23 2215 5\' 1"  (1.549 m)     Head Circumference --      Peak Flow --      Pain Score 08/16/23 2215 0     Pain Loc --      Pain Education --      Exclude from Growth Chart --     Most recent vital signs: Vitals:   08/16/23 2217  BP: 127/81  Pulse: (!) 105  Resp: 16  Temp: 98.3 F (36.8 C)  SpO2: 100%     Constitutional: Alert, NAD. Able to speak in complete sentences without cough or dyspnea  Eyes: Conjunctivae are normal.  Head: Atraumatic. Nose: No congestion/rhinnorhea. Mouth/Throat: Mucous membranes are moist.  Left tonsil: Presence of adnexal tissue attached to the tonsil.  No bleeding, no exudates, no peritonsillar erythema.  Tonsils are not enlarged. Neck: Painless ROM. Supple. No JVD, nodes, thyromegaly  Cardiovascular:   Good peripheral circulation.RRR no murmurs, gallops, rubs  Respiratory: Normal respiratory effort.  No retractions. Clear to  auscultation bilaterally without wheezing or crackles  Gastrointestinal: Soft and nontender.  Musculoskeletal:  no deformity Neurologic:  MAE spontaneously. No gross focal neurologic deficits are appreciated.  Skin:  Skin is warm, dry and intact. No rash noted. Psychiatric: Mood and affect are normal. Speech and behavior are normal.    ED Results / Procedures / Treatments   Labs (all labs ordered are listed, but only abnormal results are displayed) Labs Reviewed  GROUP A STREP BY PCR     EKG     RADIOLOGY     PROCEDURES:  Critical Care performed:   Procedures   MEDICATIONS ORDERED IN ED: Medications - No data to display Clinical Course as of 08/16/23 2346  Thu Aug 16, 2023  2325 Group A Strep by PCR Negative [AE]    Clinical Course User Index [AE] Awilda Lennox, PA-C    IMPRESSION / MDM / ASSESSMENT AND PLAN / ED COURSE  I reviewed the triage vital signs and the nursing notes.  Differential diagnosis includes, but is not limited to, tonsillar Hyperplasia, foreign body, pharyngitis.  Patient's presentation is most consistent with acute, uncomplicated illness.  Patient's diagnosis is consistent  with tonsillar asymmetry,14 weeks pregnancy. Labs are  reassuring ruling out strep. I did review the patient's allergies and medications.The patient is in stable and satisfactory condition for discharge home  Patient will be discharged home without prescriptions. Patient is to follow up with ENT as needed or otherwise directed. Patient is given ED precautions to return to the ED for any worsening or new symptoms. Discussed plan of care with patient, answered all of patient's questions, Patient agreeable to plan of care. Advised patient to take medications according to the instructions on the label. Discussed possible side effects of new medications. Patient verbalized understanding.    FINAL CLINICAL IMPRESSION(S) / ED DIAGNOSES   Final diagnoses:  Tonsil asymmetry      Rx / DC Orders   ED Discharge Orders     None        Note:  This document was prepared using Dragon voice recognition software and may include unintentional dictation errors.   Awilda Lennox, PA-C 08/16/23 2346    Marylynn Soho, MD 08/17/23 1504

## 2023-08-16 NOTE — Discharge Instructions (Signed)
 Have been diagnosed with tonsil asymmetry.  If you have any pain you can take acetaminophen .  Call Dr. Celso College make an appointment for a follow-up.  Come back to ED or call your PCP if you have new symptoms or symptoms worsen

## 2023-08-17 DIAGNOSIS — R0683 Snoring: Secondary | ICD-10-CM | POA: Diagnosis not present

## 2023-08-17 DIAGNOSIS — J351 Hypertrophy of tonsils: Secondary | ICD-10-CM | POA: Diagnosis not present

## 2023-08-27 ENCOUNTER — Ambulatory Visit (INDEPENDENT_AMBULATORY_CARE_PROVIDER_SITE_OTHER): Admitting: Certified Nurse Midwife

## 2023-08-27 ENCOUNTER — Encounter: Payer: Self-pay | Admitting: Certified Nurse Midwife

## 2023-08-27 VITALS — BP 114/64 | HR 92 | Wt 181.4 lb

## 2023-08-27 DIAGNOSIS — Z363 Encounter for antenatal screening for malformations: Secondary | ICD-10-CM

## 2023-08-27 DIAGNOSIS — Z3A15 15 weeks gestation of pregnancy: Secondary | ICD-10-CM

## 2023-08-27 DIAGNOSIS — Z368A Encounter for antenatal screening for other genetic defects: Secondary | ICD-10-CM

## 2023-08-27 DIAGNOSIS — Z3490 Encounter for supervision of normal pregnancy, unspecified, unspecified trimester: Secondary | ICD-10-CM | POA: Diagnosis not present

## 2023-08-27 NOTE — Patient Instructions (Signed)
 Second Trimester of Pregnancy  The second trimester of pregnancy is from week 14 through week 27. This is months 4 through 6 of pregnancy. During the second trimester: Morning sickness is less or has stopped. You may have more energy. You may feel hungry more often. At this time, your unborn baby is growing very fast. At the end of the sixth month, the unborn baby may be up to 12 inches long and weigh about 1 pounds. You will likely start to feel the baby move between 16 and 20 weeks of pregnancy. Body changes during your second trimester Your body continues to change during this time. The changes usually go away after your baby is born. Physical changes You will gain more weight. Your belly will get bigger. You may begin to get stretch marks on your hips, belly, and breasts. Your breasts will keep growing and may hurt. You may get dark spots or blotches on your face. A dark line from your belly button to the pubic area may appear. This line is called linea nigra. Your hair may grow faster and get thicker. Health changes You may have headaches. You may have heartburn. You may pee more often. You may have swollen, bulging veins (varicose veins). You may have trouble pooping (constipation), or swollen veins in the butt that can itch or get painful (hemorrhoids). You may have back pain. This is caused by: Weight gain. Pregnancy hormones that are relaxing the joints in your pelvis. Follow these instructions at home: Medicines Talk to your health care provider if you're taking medicines. Ask if the medicines are safe to take during pregnancy. Your provider may change the medicines that you take. Do not take any medicines unless told to by your provider. Take a prenatal vitamin that has at least 600 micrograms (mcg) of folic acid. Do not use herbal medicines, illegal drugs, or medicines that are not approved by your provider. Eating and drinking While you're pregnant your body needs  extra food for your growing baby. Talk with your provider about what to eat while pregnant. Activity Most women are able to exercise during pregnancy. Exercises may need to change as your pregnancy goes on. Talk to your provider about your activities and exercise routines. Relieving pain and discomfort Wear a good, supportive bra if your breasts hurt. Rest with your legs raised if you have leg cramps or low back pain. Take warm sitz baths to soothe pain from hemorrhoids. Use hemorrhoid cream if your provider says it's okay. Do not douche. Do not use tampons or scented pads. Do not use hot tubs, steam rooms, or saunas. Safety Wear your seatbelt at all times when you're in a car. Talk to your provider if someone hits you, hurts you, or yells at you. Talk with your provider if you're feeling sad or have thoughts of hurting yourself. Lifestyle Certain things can be harmful while you're pregnant. It's best to avoid the following: Do not drink alcohol,smoke, vape, or use products with nicotine or tobacco in them. If you need help quitting, talk with your provider. Avoid cat litter boxes and soil used by cats. These things carry germs that can cause harm to your pregnancy and your baby. General instructions Keep all follow-up visits. It helps you and your unborn baby stay as healthy as possible. Write down your questions. Take them to your prenatal visits. Your provider will: Talk with you about your overall health. Give you advice or refer you to specialists who can help with different needs,  including: Prenatal education classes. Mental health and counseling. Foods and healthy eating. Ask for help if you need help with food. Where to find more information American Pregnancy Association: americanpregnancy.org [employer] of Obstetricians and Gynecologists: acog.org Office on [employer] Health: [employer] Contact a health care provider if: You have a headache that does not go away  when you take medicine. You have any of these problems: You can't eat or drink. You throw up or feel like you may throw up. You have watery poop (diarrhea) for 2 days or more. You have pain when you pee or your pee smells bad. You have been sick for 2 days or more and are not getting better. Contact your provider right away if: You have any of these coming from your vagina: Abnormal discharge. Bad-smelling fluid. Bleeding. Your baby is moving less than usual. You have contractions, belly cramping, or have pain in your pelvis or lower back. You have symptoms of high blood pressure or preeclampsia. These include: A severe, throbbing headache that does not go away. Sudden or extreme swelling of your face, hands, legs, or feet. Vision problems: You see spots. You have blurry vision. Your eyes are sensitive to light. If you can't reach the provider, go to an urgent care or emergency room. Get help right away if: You faint, become confused, or can't think clearly. You have chest pain or trouble breathing. You have any kind of injury, such as from a fall or a car crash. These symptoms may be an emergency. Call 911 right away. Do not wait to see if the symptoms will go away. Do not drive yourself to the hospital. This information is not intended to replace advice given to you by your health care provider. Make sure you discuss any questions you have with your health care provider. Document Revised: 01/04/2023 Document Reviewed: 08/04/2022 Elsevier Patient Education  2024 [employer].

## 2023-08-27 NOTE — Assessment & Plan Note (Signed)
 Red flag symptoms reviewed. Anatomy ultrasound ordered. Declines AFP.

## 2023-08-27 NOTE — Progress Notes (Signed)
    Return Prenatal Note   Subjective   33 y.o. G1P0000 at [redacted]w[redacted]d presents for this follow-up prenatal visit.  Patient feeling well, possibly feeling flutters. Patient reports: Movement: Absent Contractions: Not present  Objective   Flow sheet Vitals: Pulse Rate: 92 BP: 114/64 Fetal Heart Rate (bpm): 145 Total weight gain: 18 lb 6.4 oz (8.346 kg)  General Appearance  No acute distress, well appearing, and well nourished Pulmonary   Normal work of breathing Neurologic   Alert and oriented to person, place, and time Psychiatric   Mood and affect within normal limits  Assessment/Plan   Plan  33 y.o. G1P0000 at [redacted]w[redacted]d presents for follow-up OB visit. Reviewed prenatal record including previous visit note.  Supervision of low-risk pregnancy Red flag symptoms reviewed. Anatomy ultrasound ordered. Declines AFP.      Orders Placed This Encounter  Procedures   US  OB Comp + 14 Wk    Standing Status:   Future    Expected Date:   09/27/2023    Expiration Date:   11/27/2023    Reason for Exam (SYMPTOM  OR DIAGNOSIS REQUIRED):   anatomy ultrasound    Preferred Imaging Location?:   [facility]   Return in 4 weeks (on 09/24/2023) for ROB.   Future Appointments  Date Time Provider Department Center  09/24/2023  3:35 PM Free, Verita Glassman, CNM [facility] None  09/25/2023  1:00 PM [facility]  3 [facility]    For next visit:  continue with routine prenatal care     Forestine Igo, CNM  05/12/254:36 PM

## 2023-08-29 ENCOUNTER — Other Ambulatory Visit

## 2023-08-29 DIAGNOSIS — Z368A Encounter for antenatal screening for other genetic defects: Secondary | ICD-10-CM | POA: Diagnosis not present

## 2023-08-31 LAB — AFP, SERUM, OPEN SPINA BIFIDA
AFP MoM: 1.98
AFP Value: 65.1 ng/mL
Gest. Age on Collection Date: 16.1 wk
Maternal Age At EDD: 30.9 a
OSBR Risk 1 IN: 1685
Test Results:: NEGATIVE
Weight: 181 [lb_av]

## 2023-09-03 ENCOUNTER — Ambulatory Visit: Payer: Self-pay | Admitting: Certified Nurse Midwife

## 2023-09-19 ENCOUNTER — Encounter: Payer: 59 | Admitting: Nurse Practitioner

## 2023-09-24 ENCOUNTER — Ambulatory Visit (INDEPENDENT_AMBULATORY_CARE_PROVIDER_SITE_OTHER)

## 2023-09-24 VITALS — BP 114/73 | HR 93 | Wt 185.0 lb

## 2023-09-24 DIAGNOSIS — Z3490 Encounter for supervision of normal pregnancy, unspecified, unspecified trimester: Secondary | ICD-10-CM | POA: Diagnosis not present

## 2023-09-24 NOTE — Assessment & Plan Note (Signed)
-   Anatomy US  scheduled  6/9 - Discussed round ligament pain, and increased pressure during pregnancy. Reviewed comfort measures such as; bellybands and gentle stretches.  - Reviewed kick counts and preterm labor warning signs. Instructed to call office or come to hospital with persistent headache, vision changes, regular contractions, leaking of fluid, decreased fetal movement or vaginal bleeding.

## 2023-09-24 NOTE — Progress Notes (Signed)
    Return Prenatal Note   Assessment/Plan   Plan  33 y.o. G1P0000 at [redacted]w[redacted]d presents for follow-up OB visit. Reviewed prenatal record including previous visit note.  Supervision of low-risk pregnancy  - Anatomy US  scheduled  6/9 - Discussed round ligament pain, and increased pressure during pregnancy. Reviewed comfort measures such as; bellybands and gentle stretches.  - Reviewed kick counts and preterm labor warning signs. Instructed to call office or come to hospital with persistent headache, vision changes, regular contractions, leaking of fluid, decreased fetal movement or vaginal bleeding.      No orders of the defined types were placed in this encounter.  Return in about 4 weeks (around 10/22/2023) for ROB.   Future Appointments  Date Time Provider Department Center  09/25/2023  1:00 PM [facility]  3 [facility]  10/22/2023  3:55 PM Angelita Kendall, CNM [facility] None    For next visit:  Routine prenatal care    Subjective  Feeling well overall, reports increased pressure in the from of her abdomen, and round ligament pain.   Movement: Present Contractions: Not present  Objective   Flow sheet Vitals: Pulse Rate: 93 BP: 114/73 Fundal Height: 20 cm Fetal Heart Rate (bpm): 159 Total weight gain: 9.979 kg  General Appearance  No acute distress, well appearing, and well nourished Pulmonary   Normal work of breathing Neurologic   Alert and oriented to person, place, and time Psychiatric   Mood and affect within normal limits  Rosealee Concha, [facility] 09/24/23 3:59 PM

## 2023-09-25 ENCOUNTER — Ambulatory Visit
Admission: RE | Admit: 2023-09-25 | Discharge: 2023-09-25 | Disposition: A | Discharge status: Home / Self Care | Source: Ambulatory Visit | Attending: Certified Nurse Midwife | Admitting: Certified Nurse Midwife

## 2023-09-25 DIAGNOSIS — Z3689 Encounter for other specified antenatal screening: Secondary | ICD-10-CM | POA: Diagnosis not present

## 2023-09-25 DIAGNOSIS — Z363 Encounter for antenatal screening for malformations: Secondary | ICD-10-CM | POA: Diagnosis not present

## 2023-09-25 DIAGNOSIS — Z3A19 19 weeks gestation of pregnancy: Secondary | ICD-10-CM | POA: Diagnosis not present

## 2023-10-02 ENCOUNTER — Ambulatory Visit: Payer: Self-pay | Admitting: Certified Nurse Midwife

## 2023-10-02 DIAGNOSIS — Z363 Encounter for antenatal screening for malformations: Secondary | ICD-10-CM

## 2023-10-07 ENCOUNTER — Encounter: Payer: Self-pay | Admitting: Nurse Practitioner

## 2023-10-22 ENCOUNTER — Encounter: Payer: Self-pay | Admitting: Advanced Practice Midwife

## 2023-10-22 ENCOUNTER — Ambulatory Visit (INDEPENDENT_AMBULATORY_CARE_PROVIDER_SITE_OTHER): Admitting: Advanced Practice Midwife

## 2023-10-22 VITALS — BP 127/79 | HR 91 | Wt 194.0 lb

## 2023-10-22 DIAGNOSIS — Z113 Encounter for screening for infections with a predominantly sexual mode of transmission: Secondary | ICD-10-CM

## 2023-10-22 DIAGNOSIS — Z130 Encounter for screening for diseases of the blood and blood-forming organs and certain disorders involving the immune mechanism: Secondary | ICD-10-CM

## 2023-10-22 DIAGNOSIS — Z3492 Encounter for supervision of normal pregnancy, unspecified, second trimester: Secondary | ICD-10-CM | POA: Diagnosis not present

## 2023-10-22 DIAGNOSIS — Z131 Encounter for screening for diabetes mellitus: Secondary | ICD-10-CM

## 2023-10-22 DIAGNOSIS — Z3A23 23 weeks gestation of pregnancy: Secondary | ICD-10-CM | POA: Diagnosis not present

## 2023-10-22 DIAGNOSIS — Z369 Encounter for antenatal screening, unspecified: Secondary | ICD-10-CM | POA: Diagnosis not present

## 2023-10-22 NOTE — Patient Instructions (Signed)
 Oral Glucose Tolerance Test During Pregnancy Why am I having this test? The oral glucose tolerance test (OGTT) is done to check how your body uses blood sugar, also called glucose. It's one of many tests used to diagnose the type of diabetes you can get while pregnant. This type of diabetes is called gestational diabetes mellitus (GDM). You may get GDM during the middle part of your pregnancy. In most cases, it goes away after you give birth. Most people get tested for GDM around weeks 24-28 of pregnancy. You may have the test sooner if: You or your mother had diabetes while pregnant. A person in your family has diabetes. You're having more than one baby this pregnancy. You've had a baby before who weighed more than 9 pounds (4 kg) at birth. You have high blood pressure or heart disease. You have a large body. You're not active. What is being tested? This test measures your blood sugar at different times. It shows how well your body uses the sugar in your blood. What kind of sample is taken?  A sample of blood is needed for this test. The sample is taken by putting a needle into a blood vessel. How do I prepare for this test? Eat your normal meals the day before the test. Your health care provider will tell you about: Eating or drinking on the day of the test. You may need to fast for 8-10 hours before the test. When you fast, you can only have water. Changing or stopping your regular medicines. Some medicines may affect your test results. Tell a health care provider about: All medicines you take. These include vitamins, herbs, eye drops, and creams. What happens during the test? The test involves these steps: Your blood sugar will be checked. It's called your fasting blood sugar if you fasted before the test. You'll drink a sugary mixture. Your blood sugar will be checked again. For a 1-hour test, it will be checked after an hour. For a 3-hour test, it will be checked 1, 2, and 3 hours  after you drink the sugary mixture. The test takes 1-3 hours. You'll need to stay at the testing place during this time. During the testing time: Do not eat or drink anything after the sugary drink. Do not exercise. Do not use any products that contain nicotine or tobacco. These products include cigarettes, chewing tobacco, and vaping devices, such as e-cigarettes. The test may vary among providers and hospitals. How are the results reported? Your provider will compare your results to normal values for the kind of test that you had done. You may need to call or meet with your provider to get your results. What do the results mean? Your provider can tell you what blood sugar levels are normal for the test you're doing. If two or more of your blood sugar levels are at or above normal, you may be diagnosed with GDM. If only one level is high, your provider may suggest: Doing the test again. Doing other tests to confirm a diagnosis. Talk with your provider about what your results mean. Questions to ask your health care provider Ask your provider, or the department doing the test: When will my results be ready? How will I get my results? What are my next steps? This information is not intended to replace advice given to you by your health care provider. Make sure you discuss any questions you have with your health care provider. Document Revised: 08/07/2022 Document Reviewed: 08/07/2022 Elsevier Patient Education  2024 [employer] Inc[employer]

## 2023-10-22 NOTE — Progress Notes (Signed)
 Routine Prenatal Care Visit  Subjective  Tara Rose is a 33 y.o. G1P0000 at [redacted]w[redacted]d being seen today for ongoing prenatal care.  She is currently monitored for the following issues for this low-risk pregnancy and has Leucocytosis and Supervision of low-risk pregnancy on their problem list.  ----------------------------------------------------------------------------------- Patient reports she is doing well. We had a lengthy discussion regarding 28 week glucose testing. Reviewed difference between early and 3rd trimester tests. She has a history of T2DM which she says has been in remission after lifestyle changes. Diet is low carb. She does drink zero sugar sodas and has limited physical activity. Feels tired after work. Not sure if her prenatal vitamin has iron. Recommend she take PNV with Fe, increase physical activity, decrease sodas even though they are zero sugar- may not be metabolizing well. Abdominal support band to help relieve pressure on bladder.   Contractions: Not present. Vag. Bleeding: None.  Movement: Present. Leaking Fluid denies.  ----------------------------------------------------------------------------------- The following portions of the patient's history were reviewed and updated as appropriate: allergies, current medications, past family history, past medical history, past social history, past surgical history and problem list. Problem list updated.  Objective  Blood pressure 127/79, pulse 91, weight 194 lb (88 kg), last menstrual period 05/04/2023. Pregravid weight 163 lb (73.9 kg) Total Weight Gain 31 lb (14.1 kg) Urinalysis: Urine Protein    Urine Glucose    Fetal Status: Fetal Heart Rate (bpm): 138 Fundal Height: 25 cm Movement: Present     General:  Alert, oriented and cooperative. Patient is in no acute distress.  Skin: Skin is warm and dry. No rash noted.   Cardiovascular: Normal heart rate noted  Respiratory: Normal respiratory effort, no problems with  respiration noted  Abdomen: Soft, gravid, appropriate for gestational age. Pain/Pressure: Absent     Pelvic:  Cervical exam deferred        Extremities: Normal range of motion.  Edema: None  Mental Status: Normal mood and affect. Normal behavior. Normal judgment and thought content.   Assessment   32 y.o. G1P0000 at [redacted]w[redacted]d by  02/12/2024, by Ultrasound presenting for routine prenatal visit  Plan   G1 Problems (from 07/02/23 to present)     Problem Noted Diagnosed Resolved   Supervision of low-risk pregnancy 07/02/2023 by Alaina Waddell SAUNDERS, CMA  No   Overview Addendum 10/22/2023  4:06 PM by Lynda Bradley, CNM   Clinical Staff Provider  Office Location  [facility] Dating  02/12/2024, by Ultrasound  Language  English Anatomy US     Flu Vaccine  no Genetic Screen  NIPS: negative, female  RSV Vaccine      Covid Vaccine  no    TDaP vaccine    Hgb A1C or  GTT Early : Hgb A1C 5.2 Third trimester :   Covid    LAB RESULTS   Rhogam  AB/Positive/-- (04/15 1609)  Blood Type AB/Positive/-- (04/15 1609)   RSV  Antibody Negative (04/15 1609)  Feeding Plan breast Rubella <0.90 (04/15 1609)  Contraception none RPR Non Reactive (04/15 1609)   Circumcision yes HBsAg Negative (04/15 1609)   Pediatrician  unsure HIV Non Reactive (04/15 1609)  Support Person [place] Varicella Reactive (04/15 1609)  Prenatal Classes unsure GBS  (For PCN allergy, check sensitivities)     Hep C Non Reactive (04/15 1609)   BTL Consent  Pap Diagnosis  Date Value Ref Range Status  07/31/2023   Final   - Negative for intraepithelial lesion or malignancy (NILM)  VBAC Consent NA Hgb Electro      CF      SMA                    Preterm labor symptoms and general obstetric precautions including but not limited to vaginal bleeding, contractions, leaking of fluid and fetal movement were reviewed in detail with the patient. Please refer to After Visit Summary for other counseling recommendations.   Return in about  4 weeks (around 11/19/2023) for 28 wk labs and rob.  Slater Rains, CNM 10/22/2023 4:38 PM

## 2023-10-23 ENCOUNTER — Encounter: Payer: Self-pay | Admitting: Certified Nurse Midwife

## 2023-11-01 ENCOUNTER — Ambulatory Visit

## 2023-11-01 DIAGNOSIS — Z3A25 25 weeks gestation of pregnancy: Secondary | ICD-10-CM

## 2023-11-01 DIAGNOSIS — Z363 Encounter for antenatal screening for malformations: Secondary | ICD-10-CM

## 2023-11-14 DIAGNOSIS — Z3483 Encounter for supervision of other normal pregnancy, third trimester: Secondary | ICD-10-CM | POA: Diagnosis not present

## 2023-11-14 DIAGNOSIS — Z3482 Encounter for supervision of other normal pregnancy, second trimester: Secondary | ICD-10-CM | POA: Diagnosis not present

## 2023-11-19 ENCOUNTER — Ambulatory Visit (INDEPENDENT_AMBULATORY_CARE_PROVIDER_SITE_OTHER): Admitting: Obstetrics & Gynecology

## 2023-11-19 ENCOUNTER — Other Ambulatory Visit

## 2023-11-19 VITALS — BP 127/82 | HR 99 | Wt 192.0 lb

## 2023-11-19 DIAGNOSIS — O24112 Pre-existing diabetes mellitus, type 2, in pregnancy, second trimester: Secondary | ICD-10-CM

## 2023-11-19 DIAGNOSIS — O24119 Pre-existing diabetes mellitus, type 2, in pregnancy, unspecified trimester: Secondary | ICD-10-CM | POA: Diagnosis not present

## 2023-11-19 DIAGNOSIS — Z3A28 28 weeks gestation of pregnancy: Secondary | ICD-10-CM | POA: Diagnosis not present

## 2023-11-19 DIAGNOSIS — O99212 Obesity complicating pregnancy, second trimester: Secondary | ICD-10-CM | POA: Diagnosis not present

## 2023-11-19 DIAGNOSIS — E119 Type 2 diabetes mellitus without complications: Secondary | ICD-10-CM

## 2023-11-19 DIAGNOSIS — Z3A27 27 weeks gestation of pregnancy: Secondary | ICD-10-CM

## 2023-11-19 DIAGNOSIS — Z113 Encounter for screening for infections with a predominantly sexual mode of transmission: Secondary | ICD-10-CM | POA: Diagnosis not present

## 2023-11-19 DIAGNOSIS — Z3490 Encounter for supervision of normal pregnancy, unspecified, unspecified trimester: Secondary | ICD-10-CM | POA: Diagnosis not present

## 2023-11-19 DIAGNOSIS — Z130 Encounter for screening for diseases of the blood and blood-forming organs and certain disorders involving the immune mechanism: Secondary | ICD-10-CM | POA: Diagnosis not present

## 2023-11-19 DIAGNOSIS — O99210 Obesity complicating pregnancy, unspecified trimester: Secondary | ICD-10-CM | POA: Diagnosis not present

## 2023-11-19 DIAGNOSIS — E669 Obesity, unspecified: Secondary | ICD-10-CM

## 2023-11-19 NOTE — Progress Notes (Signed)
   PRENATAL VISIT NOTE  Subjective:  Tara Rose is a 33 y.o. G1P0000 at [redacted]w[redacted]d being seen today for ongoing prenatal care.  She is currently monitored for the following issues for this high-risk pregnancy and has Leucocytosis; Supervision of low-risk pregnancy; Pre-existing type 2 diabetes mellitus during pregnancy, antepartum; and Obesity in pregnancy on their problem list.  Patient reports no complaints.  Contractions: Not present. Vag. Bleeding: None.  Movement: Present. Denies leaking of fluid.   The following portions of the patient's history were reviewed and updated as appropriate: allergies, current medications, past family history, past medical history, past social history, past surgical history and problem list.   Objective:    Vitals:   11/19/23 0926  BP: 127/82  Pulse: 99  Weight: 192 lb (87.1 kg)    Fetal Status:  Fetal Heart Rate (bpm): 140 Fundal Height: 28 cm Movement: Present    General: Alert, oriented and cooperative. Patient is in no acute distress.  Skin: Skin is warm and dry. No rash noted.   Cardiovascular: Normal heart rate noted  Respiratory: Normal respiratory effort, no problems with respiration noted  Abdomen: Soft, gravid, appropriate for gestational age.  Pain/Pressure: Present     Pelvic: Cervical exam deferred        Extremities: Normal range of motion.     Mental Status: Normal mood and affect. Normal behavior. Normal judgment and thought content.   Assessment and Plan:  Pregnancy: G1P0000 at [redacted]w[redacted]d 1. [redacted] weeks gestation of pregnancy (Primary)  - CBC - HEP, RPR, HIV Panel - Antibody screen  2. Encounter for supervision of low-risk pregnancy, antepartum  - CBC - HEP, RPR, HIV Panel - Antibody screen - she declines TDAP  3. Obesity in pregnancy - she has gained 29 pounds - we discussed that she should return to her habit of walking daily  4. Pre-existing type 2 diabetes mellitus during pregnancy, antepartum - She has been checking  her sugars routinely. Her 2 hour PCs are all less than 120. Her fastings have all been less than 90 until the last week when they have increased.  - She will message me with her fastings for the next week and we will decide if she needs meds. We discussed metformin  and insulin . She has taken metformin  in the past.   5. Screening examination for STI  - HEP, RPR, HIV Panel  6. Screening for deficiency anemia  - CBC  Preterm labor symptoms and general obstetric precautions including but not limited to vaginal bleeding, contractions, leaking of fluid and fetal movement were reviewed in detail with the patient. Please refer to After Visit Summary for other counseling recommendations.   Return in about 2 weeks (around 12/03/2023) for ROB.  No future appointments.  Harland JAYSON Birkenhead, MD

## 2023-11-20 LAB — CBC
Hematocrit: 34.7 % (ref 34.0–46.6)
Hemoglobin: 11.8 g/dL (ref 11.1–15.9)
MCH: 31.1 pg (ref 26.6–33.0)
MCHC: 34 g/dL (ref 31.5–35.7)
MCV: 92 fL (ref 79–97)
Platelets: 210 x10E3/uL (ref 150–450)
RBC: 3.79 x10E6/uL (ref 3.77–5.28)
RDW: 12.8 % (ref 11.7–15.4)
WBC: 12.8 x10E3/uL — ABNORMAL HIGH (ref 3.4–10.8)

## 2023-11-20 LAB — HEP, RPR, HIV PANEL
HIV Screen 4th Generation wRfx: NONREACTIVE
Hepatitis B Surface Ag: NEGATIVE
RPR Ser Ql: NONREACTIVE

## 2023-11-20 LAB — ANTIBODY SCREEN: Antibody Screen: NEGATIVE

## 2023-11-26 ENCOUNTER — Encounter: Payer: Self-pay | Admitting: Certified Nurse Midwife

## 2023-11-30 ENCOUNTER — Encounter: Payer: Self-pay | Admitting: Certified Nurse Midwife

## 2023-12-03 ENCOUNTER — Ambulatory Visit (INDEPENDENT_AMBULATORY_CARE_PROVIDER_SITE_OTHER): Admitting: Obstetrics & Gynecology

## 2023-12-03 VITALS — BP 112/78 | HR 101 | Wt 195.0 lb

## 2023-12-03 DIAGNOSIS — Z3A30 30 weeks gestation of pregnancy: Secondary | ICD-10-CM | POA: Diagnosis not present

## 2023-12-03 DIAGNOSIS — O99210 Obesity complicating pregnancy, unspecified trimester: Secondary | ICD-10-CM

## 2023-12-03 DIAGNOSIS — O0990 Supervision of high risk pregnancy, unspecified, unspecified trimester: Secondary | ICD-10-CM | POA: Insufficient documentation

## 2023-12-03 DIAGNOSIS — O24119 Pre-existing diabetes mellitus, type 2, in pregnancy, unspecified trimester: Secondary | ICD-10-CM | POA: Diagnosis not present

## 2023-12-03 MED ORDER — METFORMIN HCL ER 500 MG PO TB24: ORAL_TABLET | ORAL | 3 refills | Status: DC

## 2023-12-03 NOTE — Progress Notes (Signed)
   PRENATAL VISIT NOTE  Subjective:  Tara Rose is a 33 y.o. G1P0000 at [redacted]w[redacted]d being seen today for ongoing prenatal care.  She is currently monitored for the following issues for this high-risk pregnancy and has Leucocytosis; Supervision of low-risk pregnancy; Pre-existing type 2 diabetes mellitus during pregnancy, antepartum; Obesity in pregnancy; and Supervision of high risk pregnancy, antepartum on their problem list.  Patient reports no complaints.  Contractions: Not present. Vag. Bleeding: None.  Movement: Present. Denies leaking of fluid.   The following portions of the patient's history were reviewed and updated as appropriate: allergies, current medications, past family history, past medical history, past social history, past surgical history and problem list.   Objective:    Vitals:   12/03/23 1559  BP: 112/78  Pulse: (!) 101  Weight: 195 lb (88.5 kg)    Fetal Status:  Fetal Heart Rate (bpm): 129 Fundal Height: 29 cm Movement: Present    General: Alert, oriented and cooperative. Patient is in no acute distress.  Skin: Skin is warm and dry. No rash noted.   Cardiovascular: Normal heart rate noted  Respiratory: Normal respiratory effort, no problems with respiration noted  Abdomen: Soft, gravid, appropriate for gestational age.  Pain/Pressure: Present     Pelvic: Cervical exam deferred        Extremities: Normal range of motion.  Edema: None  Mental Status: Normal mood and affect. Normal behavior. Normal judgment and thought content.   Assessment and Plan:  Pregnancy: G1P0000 at [redacted]w[redacted]d 1. Obesity in pregnancy (Primary)  - US  MFM OB FOLLOW UP; Future  2. Pre-existing type 2 diabetes mellitus during pregnancy, antepartum - She reports that all of her 2 hour PCs are less than 120 but that her fastings have all creeped up above 100. She has used metformin  in the past and finds the XR version to be better on her stomach. I will prescribe this. We did discuss insulin  as  well. - US  MFM OB FOLLOW UP; Future  3. [redacted] weeks gestation of pregnancy   4. Supervision of high risk pregnancy, antepartum  - US  MFM OB FOLLOW UP; Future  Preterm labor symptoms and general obstetric precautions including but not limited to vaginal bleeding, contractions, leaking of fluid and fetal movement were reviewed in detail with the patient. Please refer to After Visit Summary for other counseling recommendations.   Return in about 2 weeks (around 12/17/2023).  No future appointments.  Harland JAYSON Birkenhead, MD

## 2023-12-10 ENCOUNTER — Encounter: Payer: Self-pay | Admitting: Certified Nurse Midwife

## 2023-12-11 ENCOUNTER — Encounter: Payer: Self-pay | Admitting: Obstetrics & Gynecology

## 2023-12-11 NOTE — Addendum Note (Signed)
 Addended by: TAFT CAMELIA MATSU on: 12/11/2023 03:55 PM   Modules accepted: Orders

## 2023-12-11 NOTE — Addendum Note (Signed)
 Addended by: TAFT CAMELIA MATSU on: 12/11/2023 03:03 PM   Modules accepted: Orders

## 2023-12-11 NOTE — Addendum Note (Signed)
 Addended by: TAFT CAMELIA MATSU on: 12/11/2023 04:36 PM   Modules accepted: Orders

## 2023-12-12 ENCOUNTER — Other Ambulatory Visit

## 2023-12-12 DIAGNOSIS — O24113 Pre-existing diabetes mellitus, type 2, in pregnancy, third trimester: Secondary | ICD-10-CM

## 2023-12-12 DIAGNOSIS — O24119 Pre-existing diabetes mellitus, type 2, in pregnancy, unspecified trimester: Secondary | ICD-10-CM

## 2023-12-12 DIAGNOSIS — Z3A31 31 weeks gestation of pregnancy: Secondary | ICD-10-CM

## 2023-12-12 DIAGNOSIS — O99210 Obesity complicating pregnancy, unspecified trimester: Secondary | ICD-10-CM

## 2023-12-12 DIAGNOSIS — E669 Obesity, unspecified: Secondary | ICD-10-CM

## 2023-12-12 DIAGNOSIS — O99213 Obesity complicating pregnancy, third trimester: Secondary | ICD-10-CM | POA: Diagnosis not present

## 2023-12-18 ENCOUNTER — Encounter: Payer: Self-pay | Admitting: Obstetrics & Gynecology

## 2023-12-19 ENCOUNTER — Other Ambulatory Visit

## 2023-12-19 ENCOUNTER — Ambulatory Visit (INDEPENDENT_AMBULATORY_CARE_PROVIDER_SITE_OTHER): Admitting: Obstetrics & Gynecology

## 2023-12-19 VITALS — BP 112/78 | HR 102 | Wt 198.0 lb

## 2023-12-19 DIAGNOSIS — O24119 Pre-existing diabetes mellitus, type 2, in pregnancy, unspecified trimester: Secondary | ICD-10-CM

## 2023-12-19 DIAGNOSIS — Z3A32 32 weeks gestation of pregnancy: Secondary | ICD-10-CM | POA: Diagnosis not present

## 2023-12-19 DIAGNOSIS — O0990 Supervision of high risk pregnancy, unspecified, unspecified trimester: Secondary | ICD-10-CM | POA: Diagnosis not present

## 2023-12-19 DIAGNOSIS — O99210 Obesity complicating pregnancy, unspecified trimester: Secondary | ICD-10-CM | POA: Diagnosis not present

## 2023-12-19 NOTE — Progress Notes (Signed)
   PRENATAL VISIT NOTE  Subjective:  Tara Rose is a 33 y.o. G1P0000 at [redacted]w[redacted]d being seen today for ongoing prenatal care.  She is currently monitored for the following issues for this high-risk pregnancy and has Leucocytosis; Supervision of low-risk pregnancy; Pre-existing type 2 diabetes mellitus during pregnancy, antepartum; Obesity in pregnancy; and Supervision of high risk pregnancy, antepartum on their problem list.  Patient reports sciatia.  Contractions: Not present. Vag. Bleeding: None.  Movement: Present. Denies leaking of fluid.   The following portions of the patient's history were reviewed and updated as appropriate: allergies, current medications, past family history, past medical history, past social history, past surgical history and problem list.   Objective:    Vitals:   12/19/23 1510  BP: 112/78  Pulse: (!) 102  Weight: 198 lb (89.8 kg)    Fetal Status:  Fetal Heart Rate (bpm): 145   Movement: Present    General: Alert, oriented and cooperative. Patient is in no acute distress.  Skin: Skin is warm and dry. No rash noted.   Cardiovascular: Normal heart rate noted  Respiratory: Normal respiratory effort, no problems with respiration noted  Abdomen: Soft, gravid, appropriate for gestational age.  Pain/Pressure: Present     Pelvic: Cervical exam deferred        Extremities: Normal range of motion.     Mental Status: Normal mood and affect. Normal behavior. Normal judgment and thought content.   Assessment and Plan:  Pregnancy: G1P0000 at [redacted]w[redacted]d 1. Supervision of high risk pregnancy, antepartum (Primary)   2. Obesity in pregnancy   3. Pre-existing type 2 diabetes mellitus during pregnancy, antepartum - her sugars are stable, almost all in range - weekly NST and ROB visit in 2 weeks. She will continue to send me her sugars via [facility]  4. [redacted] weeks gestation of pregnancy  5. Sciatica- I taught her some exercises and rec'd a chiro  Preterm labor symptoms  and general obstetric precautions including but not limited to vaginal bleeding, contractions, leaking of fluid and fetal movement were reviewed in detail with the patient. Please refer to After Visit Summary for other counseling recommendations.   No follow-ups on file.  Future Appointments  Date Time Provider Department Center  12/26/2023  3:15 PM AOB-NST ROOM [facility] None  01/02/2024  3:15 PM AOB-NST ROOM [facility] None  01/02/2024  3:55 PM Leigh Sober, MD [facility] None  01/09/2024  3:00 PM AOB-A[facility] US  1 AOB-IMG None  01/16/2024  3:15 PM AOB-NST ROOM [facility] None  01/16/2024  3:55 PM Slaughterbeck, Damien, CNM AOB-[facility] None    Harland JAYSON Birkenhead, MD

## 2023-12-19 NOTE — Progress Notes (Signed)
 Tara Rose Oct 06, 1990 [redacted]w[redacted]d  Fetus A Non-Stress Test Interpretation for 12/19/23  Indication: Diabetes  Fetal Heart Rate A Mode: External Baseline Rate (A): 145 bpm Variability: Moderate Accelerations: 15 x 15 Decelerations: None Multiple birth?: No  Uterine Activity Mode: Toco Contraction Frequency (min): None  Interpretation (Fetal Testing) Nonstress Test Interpretation: Reactive Overall Impression: Reassuring for gestational age

## 2023-12-26 ENCOUNTER — Ambulatory Visit

## 2023-12-26 VITALS — BP 115/76 | HR 89 | Ht 61.0 in | Wt 195.6 lb

## 2023-12-26 DIAGNOSIS — E119 Type 2 diabetes mellitus without complications: Secondary | ICD-10-CM

## 2023-12-26 DIAGNOSIS — O24113 Pre-existing diabetes mellitus, type 2, in pregnancy, third trimester: Secondary | ICD-10-CM | POA: Diagnosis not present

## 2023-12-26 DIAGNOSIS — Z3A33 33 weeks gestation of pregnancy: Secondary | ICD-10-CM

## 2023-12-26 DIAGNOSIS — O24119 Pre-existing diabetes mellitus, type 2, in pregnancy, unspecified trimester: Secondary | ICD-10-CM

## 2023-12-26 NOTE — Progress Notes (Signed)
    NURSE VISIT NOTE  Subjective:    Patient ID: Tara Rose, female    DOB: 1990/10/03, 33 y.o.   MRN: [id number]  HPI  Patient is a 33 y.o. G27P0000 female who presents for fetal monitoring per order from Harland Birkenhead, MD. Patient reports her fasting blood sugars have gradually increased over the past week and continue to be between 100-105 despite healthy eating and high protein snack at bedtime.  Objective:    BP 115/76   Pulse 89   Ht 5' 1 (1.549 m)   Wt 195 lb 9.6 oz (88.7 kg)   LMP 05/04/2023 (Approximate)   BMI 36.96 kg/m  Estimated Date of Delivery: 02/12/24  [redacted]w[redacted]d  Fetus A Non-Stress Test Interpretation for 12/26/23  Indication: Pre existing T2DM  Fetal Heart Rate A Mode: External Baseline Rate (A): 135 bpm Variability: Moderate Accelerations: 15 x 15 Decelerations: None Multiple birth?: No  Uterine Activity Mode: Toco Contraction Frequency (min): None noted  Interpretation (Fetal Testing) Nonstress Test Interpretation: Reactive Overall Impression: Reassuring for gestational age   Assessment:   1. Pre-existing type 2 diabetes mellitus during pregnancy, antepartum   2. [redacted] weeks gestation of pregnancy      Plan:   Results reviewed by Lauraine Lakes, CNM and discussed with patient. Patient advised per Lauraine Lakes, CNM to increase Metformin  from 500 mg to 1000 mg at bedtime and also increase her fat and protein intake. She will report any concerns with her blood sugars prior to her next visit.    Camelia Bars, LPN

## 2023-12-26 NOTE — Patient Instructions (Signed)
 Nonstress Test: What to Expect A nonstress test, also called an NST, is done during pregnancy to check your baby's heartbeat. The procedure can help to show if your baby is healthy. It may be done if: Your due date has passed. Your pregnancy is high risk. Your baby is moving less than normal. You've lost a previous pregnancy. Your baby is growing slowly. There's too much or too little fluid around your baby. The NST may be done in the third trimester to find out if it's best for your baby to be born early. During an NST, your baby's heartbeat is watched for at least 20 minutes. If the baby is healthy, the heart rate will go up when the baby moves and will return to normal when the baby rests. This should happen at least twice during the test. Tell a health care provider about: Any allergies you have. Any medical problems you have. All medicines you take. These include vitamins, herbs, eye drops, and creams. Any surgeries you've had. Any past pregnancies you've had. What are the risks? There are no risks to you or your baby from a nonstress test. This procedure shouldn't be painful or uncomfortable. What happens before? Eat a meal right before the test or as told by your health care team. Food may help the baby to move. Use the restroom right before the test. What happens during a nonstress test?  Two monitors will be placed on your belly. One will check your baby's heart rate, and the other will check for contractions. You may be asked to lie down on your side or to sit up. You may be given a button to press when you feel your baby move. If your baby seems to be sleeping, you may be asked to drink some juice or soda, eat a snack, or change positions. These steps may vary. Ask what you can expect. What can I expect after? Your team will talk with you about the results and tell you the next steps. If your team gave you any diet or activity instructions, make sure to follow them. Keep all  follow-up visits. This is important to check on your health and the health of your baby. This information is not intended to replace advice given to you by your health care provider. Make sure you discuss any questions you have with your health care provider. Document Revised: 03/29/2023 Document Reviewed: 03/29/2023 Elsevier Patient Education  2025 [employer].

## 2024-01-02 ENCOUNTER — Ambulatory Visit (INDEPENDENT_AMBULATORY_CARE_PROVIDER_SITE_OTHER): Admitting: Obstetrics

## 2024-01-02 ENCOUNTER — Other Ambulatory Visit

## 2024-01-02 VITALS — BP 117/79 | HR 94 | Wt 197.7 lb

## 2024-01-02 DIAGNOSIS — E119 Type 2 diabetes mellitus without complications: Secondary | ICD-10-CM | POA: Diagnosis not present

## 2024-01-02 DIAGNOSIS — O99213 Obesity complicating pregnancy, third trimester: Secondary | ICD-10-CM | POA: Diagnosis not present

## 2024-01-02 DIAGNOSIS — O24113 Pre-existing diabetes mellitus, type 2, in pregnancy, third trimester: Secondary | ICD-10-CM

## 2024-01-02 DIAGNOSIS — O99210 Obesity complicating pregnancy, unspecified trimester: Secondary | ICD-10-CM

## 2024-01-02 DIAGNOSIS — O24119 Pre-existing diabetes mellitus, type 2, in pregnancy, unspecified trimester: Secondary | ICD-10-CM

## 2024-01-02 DIAGNOSIS — O0990 Supervision of high risk pregnancy, unspecified, unspecified trimester: Secondary | ICD-10-CM

## 2024-01-02 DIAGNOSIS — Z3A34 34 weeks gestation of pregnancy: Secondary | ICD-10-CM | POA: Diagnosis not present

## 2024-01-02 DIAGNOSIS — O0993 Supervision of high risk pregnancy, unspecified, third trimester: Secondary | ICD-10-CM

## 2024-01-02 DIAGNOSIS — E669 Obesity, unspecified: Secondary | ICD-10-CM | POA: Diagnosis not present

## 2024-01-02 MED ORDER — METFORMIN HCL ER 500 MG PO TB24
1000.0000 mg | ORAL_TABLET | Freq: Every day | ORAL | 0 refills | Status: DC
Start: 2024-01-02 — End: 2024-03-01

## 2024-01-02 NOTE — Patient Instructions (Signed)
 Nonstress Test: What to Expect A nonstress test, also called an NST, is done during pregnancy to check your baby's heartbeat. The procedure can help to show if your baby is healthy. It may be done if: Your due date has passed. Your pregnancy is high risk. Your baby is moving less than normal. You've lost a previous pregnancy. Your baby is growing slowly. There's too much or too little fluid around your baby. The NST may be done in the third trimester to find out if it's best for your baby to be born early. During an NST, your baby's heartbeat is watched for at least 20 minutes. If the baby is healthy, the heart rate will go up when the baby moves and will return to normal when the baby rests. This should happen at least twice during the test. Tell a health care provider about: Any allergies you have. Any medical problems you have. All medicines you take. These include vitamins, herbs, eye drops, and creams. Any surgeries you've had. Any past pregnancies you've had. What are the risks? There are no risks to you or your baby from a nonstress test. This procedure shouldn't be painful or uncomfortable. What happens before? Eat a meal right before the test or as told by your health care team. Food may help the baby to move. Use the restroom right before the test. What happens during a nonstress test?  Two monitors will be placed on your belly. One will check your baby's heart rate, and the other will check for contractions. You may be asked to lie down on your side or to sit up. You may be given a button to press when you feel your baby move. If your baby seems to be sleeping, you may be asked to drink some juice or soda, eat a snack, or change positions. These steps may vary. Ask what you can expect. What can I expect after? Your team will talk with you about the results and tell you the next steps. If your team gave you any diet or activity instructions, make sure to follow them. Keep all  follow-up visits. This is important to check on your health and the health of your baby. This information is not intended to replace advice given to you by your health care provider. Make sure you discuss any questions you have with your health care provider. Document Revised: 03/29/2023 Document Reviewed: 03/29/2023 Elsevier Patient Education  2025 [employer].

## 2024-01-02 NOTE — Progress Notes (Signed)
    Return Prenatal Note   Subjective  32 y.o. G1P0000 at [redacted]w[redacted]d presents for this follow-up prenatal visit. Pregnancy notable for T2DM and BMI.   T2DM:  Patient has increased her Metformin  to 1000 at bedtime, requesting new Rx for two 500s. Shows her glucose values on her phone: -Fastings: in 90s -Postprandials: all < 140 with exception of one, 148   Patient reports: Movement: Present Contractions: Irritability Denies vaginal bleeding or leaking fluid. Objective  Flow sheet Vitals: Pulse Rate: 94 BP: 117/79 Fundal Height: 34 cm Fetal Heart Rate (bpm): 140 Total weight gain: 34 lb 11.2 oz (15.7 kg)  General Appearance  No acute distress, well appearing, and well nourished Pulmonary   Normal work of breathing Neurologic   Alert and oriented to person, place, and time Psychiatric   Mood and affect within normal limits  Fetus A Non-Stress Test Interpretation for 01/02/24 Indication: Diabetes, Type 2  Fetal Heart Rate A Baseline Rate (A): 140 bpm Variability: Moderate Accelerations: 15 x 15 Decelerations: None  Uterine Activity Mode: Toco Contraction Frequency (min): N/A Contraction Duration (sec): N/A  Interpretation (Fetal Testing) Nonstress Test Interpretation: Reactive Overall Impression: Reassuring for gestational age  Assessment/Plan   Plan  33 y.o. G1P0000 at [redacted]w[redacted]d by 7wk US  presents for follow-up OB visit. Reviewed prenatal record including previous visit note.  1. Supervision of high risk pregnancy, antepartum (Primary) -Declines Flu and RSV  2. Pre-existing type 2 diabetes mellitus during pregnancy, antepartum -Glucose in excellent control, fastings back in range since inc Metformin  to 1000mg  at bedtime, new Rx sent -NST reactive today, continue weekly -Most recent growth at [redacted]w[redacted]d, EFW 53%, AC 66%, and AFI 15.07; repeat growth at 35-36wks -Plan for delivery in 39th week  3. Obesity in pregnancy -PG-BMI 30.81; TWG 34#, cautioned to slow; continue  daily walking   Orders Placed This Encounter  Procedures   Fetal nonstress test    Standing Status:   Future    Expiration Date:   01/01/2025   Return in about 1 week (around 01/09/2024) for NST; 2wks ROB w/NST.   Future Appointments  Date Time Provider Department Center  01/09/2024  3:00 PM [facility]OB-AOB US  1 AOB-IMG None  01/16/2024  3:15 PM AOB-NST ROOM [facility]OB-A[facility] None  01/16/2024  3:55 PM Slaughterbeck, Damien, CNM AOB-AOB None   For next visit:  ROB with GBS screening     Estil Mangle, DO [facility]

## 2024-01-07 ENCOUNTER — Encounter: Payer: Self-pay | Admitting: Certified Nurse Midwife

## 2024-01-09 ENCOUNTER — Ambulatory Visit

## 2024-01-09 ENCOUNTER — Encounter: Admitting: Advanced Practice Midwife

## 2024-01-09 DIAGNOSIS — O99213 Obesity complicating pregnancy, third trimester: Secondary | ICD-10-CM

## 2024-01-09 DIAGNOSIS — E669 Obesity, unspecified: Secondary | ICD-10-CM

## 2024-01-09 DIAGNOSIS — Z3A35 35 weeks gestation of pregnancy: Secondary | ICD-10-CM | POA: Diagnosis not present

## 2024-01-09 DIAGNOSIS — O24119 Pre-existing diabetes mellitus, type 2, in pregnancy, unspecified trimester: Secondary | ICD-10-CM

## 2024-01-09 DIAGNOSIS — O99210 Obesity complicating pregnancy, unspecified trimester: Secondary | ICD-10-CM

## 2024-01-15 NOTE — Progress Notes (Unsigned)
    Return Prenatal Note   Subjective   33 y.o. G1P0000 at [redacted]w[redacted]d presents for this follow-up prenatal visit.  Patient is doing well today. She has no new concerns. Vaginal swabs were done today. Patient reports: Movement: Present Contractions: Not present  Objective   Flow sheet Vitals: Pulse Rate: 97 BP: 106/69 Total weight gain: 36 lb 8 oz (16.6 kg)  General Appearance  No acute distress, well appearing, and well nourished Pulmonary   Normal work of breathing Neurologic   Alert and oriented to person, place, and time Psychiatric   Mood and affect within normal limits  Tara Rose 06-22-1990 [redacted]w[redacted]d  Fetus A Non-Stress Test Interpretation for 01/18/24  Indication: Diabetes  Fetal Heart Rate A Mode: External Baseline Rate (A): 130 bpm Variability: Moderate Accelerations: 15 x 15 Decelerations: Variable     Interpretation (Fetal Testing) Nonstress Test Interpretation: Reactive Overall Impression: Reassuring for gestational age     Assessment/Plan   Plan  33 y.o. G1P0000 at [redacted]w[redacted]d presents for follow-up OB visit. Reviewed prenatal record including previous visit note.  Pre-existing type 2 diabetes mellitus during pregnancy, antepartum Reviewed log with patient and she reports fastings are mostly elevated 110-120s, post prandials are wnl. Reviewed with Dr. Leigh with plan to increase metformin  to 1000mg  BID.  Breech presentation Baby remains breech on BSUS today. Plan for ECV 10/9 with Dr. Leigh. If successful, will induce that day. If not successful, will proceed to pC/S that day. Reviewed with patient who is in agreement. Msg sent to Dr. Arna who agrees with plan to deliver at 37+2 for suboptimal glucose control.       Orders Placed This Encounter  Procedures   Strep Gp B NAA    For next visit:  continue with routine prenatal care     Damien Parsley, CNM [facility] 01/18/2511:08 AM

## 2024-01-16 ENCOUNTER — Other Ambulatory Visit

## 2024-01-16 ENCOUNTER — Encounter: Payer: Self-pay | Admitting: Certified Nurse Midwife

## 2024-01-16 ENCOUNTER — Ambulatory Visit: Admitting: Certified Nurse Midwife

## 2024-01-16 ENCOUNTER — Other Ambulatory Visit (HOSPITAL_COMMUNITY)
Admission: RE | Admit: 2024-01-16 | Discharge: 2024-01-16 | Disposition: A | Discharge status: Home / Self Care | Source: Ambulatory Visit | Attending: Certified Nurse Midwife | Admitting: Certified Nurse Midwife

## 2024-01-16 VITALS — BP 106/69 | HR 97 | Wt 199.5 lb

## 2024-01-16 DIAGNOSIS — O321XX0 Maternal care for breech presentation, not applicable or unspecified: Secondary | ICD-10-CM

## 2024-01-16 DIAGNOSIS — O0990 Supervision of high risk pregnancy, unspecified, unspecified trimester: Secondary | ICD-10-CM | POA: Diagnosis not present

## 2024-01-16 DIAGNOSIS — O24119 Pre-existing diabetes mellitus, type 2, in pregnancy, unspecified trimester: Secondary | ICD-10-CM | POA: Diagnosis not present

## 2024-01-16 DIAGNOSIS — O0993 Supervision of high risk pregnancy, unspecified, third trimester: Secondary | ICD-10-CM | POA: Diagnosis not present

## 2024-01-16 DIAGNOSIS — Z113 Encounter for screening for infections with a predominantly sexual mode of transmission: Secondary | ICD-10-CM | POA: Diagnosis not present

## 2024-01-16 DIAGNOSIS — Z3685 Encounter for antenatal screening for Streptococcus B: Secondary | ICD-10-CM | POA: Diagnosis not present

## 2024-01-16 DIAGNOSIS — Z3A36 36 weeks gestation of pregnancy: Secondary | ICD-10-CM

## 2024-01-18 ENCOUNTER — Encounter: Payer: Self-pay | Admitting: Obstetrics

## 2024-01-18 DIAGNOSIS — O321XX0 Maternal care for breech presentation, not applicable or unspecified: Secondary | ICD-10-CM | POA: Insufficient documentation

## 2024-01-18 LAB — CERVICOVAGINAL ANCILLARY ONLY
Chlamydia: NEGATIVE
Comment: NEGATIVE
Comment: NORMAL
Neisseria Gonorrhea: NEGATIVE

## 2024-01-18 LAB — STREP GP B NAA: Strep Gp B NAA: POSITIVE — AB

## 2024-01-18 NOTE — Assessment & Plan Note (Signed)
 Baby remains breech on BSUS today. Plan for ECV 10/9 with Dr. Leigh. If successful, will induce that day. If not successful, will proceed to pC/S that day. Reviewed with patient who is in agreement. Msg sent to Dr. Arna who agrees with plan to deliver at 37+2 for suboptimal glucose control.

## 2024-01-18 NOTE — Assessment & Plan Note (Signed)
 Reviewed log with patient and she reports fastings are mostly elevated 110-120s, post prandials are wnl. Reviewed with Dr. Leigh with plan to increase metformin  to 1000mg  BID.

## 2024-01-24 ENCOUNTER — Inpatient Hospital Stay: Admitting: Anesthesiology

## 2024-01-24 ENCOUNTER — Inpatient Hospital Stay
Admission: RE | Admit: 2024-01-24 | Discharge: 2024-01-26 | DRG: 807 | Disposition: A | Discharge status: Home / Self Care | Attending: Obstetrics | Admitting: Obstetrics

## 2024-01-24 ENCOUNTER — Other Ambulatory Visit: Payer: Self-pay

## 2024-01-24 ENCOUNTER — Encounter: Payer: Self-pay | Admitting: Certified Nurse Midwife

## 2024-01-24 DIAGNOSIS — O24415 Gestational diabetes mellitus in pregnancy, controlled by oral hypoglycemic drugs: Secondary | ICD-10-CM | POA: Diagnosis not present

## 2024-01-24 DIAGNOSIS — Z051 Observation and evaluation of newborn for suspected infectious condition ruled out: Secondary | ICD-10-CM | POA: Diagnosis not present

## 2024-01-24 DIAGNOSIS — Z833 Family history of diabetes mellitus: Secondary | ICD-10-CM | POA: Diagnosis not present

## 2024-01-24 DIAGNOSIS — E119 Type 2 diabetes mellitus without complications: Secondary | ICD-10-CM | POA: Diagnosis not present

## 2024-01-24 DIAGNOSIS — O26893 Other specified pregnancy related conditions, third trimester: Secondary | ICD-10-CM | POA: Diagnosis not present

## 2024-01-24 DIAGNOSIS — O0993 Supervision of high risk pregnancy, unspecified, third trimester: Principal | ICD-10-CM

## 2024-01-24 DIAGNOSIS — Z3A37 37 weeks gestation of pregnancy: Secondary | ICD-10-CM | POA: Diagnosis not present

## 2024-01-24 DIAGNOSIS — O2412 Pre-existing diabetes mellitus, type 2, in childbirth: Principal | ICD-10-CM | POA: Diagnosis present

## 2024-01-24 DIAGNOSIS — Z23 Encounter for immunization: Secondary | ICD-10-CM | POA: Diagnosis not present

## 2024-01-24 DIAGNOSIS — O24425 Gestational diabetes mellitus in childbirth, controlled by oral hypoglycemic drugs: Secondary | ICD-10-CM | POA: Diagnosis not present

## 2024-01-24 DIAGNOSIS — Z7984 Long term (current) use of oral hypoglycemic drugs: Secondary | ICD-10-CM

## 2024-01-24 LAB — CBC
HCT: 35 % — ABNORMAL LOW (ref 36.0–46.0)
Hemoglobin: 11.9 g/dL — ABNORMAL LOW (ref 12.0–15.0)
MCH: 29.1 pg (ref 26.0–34.0)
MCHC: 34 g/dL (ref 30.0–36.0)
MCV: 85.6 fL (ref 80.0–100.0)
Platelets: 175 K/uL (ref 150–400)
RBC: 4.09 MIL/uL (ref 3.87–5.11)
RDW: 13.7 % (ref 11.5–15.5)
WBC: 10.1 K/uL (ref 4.0–10.5)
nRBC: 0 % (ref 0.0–0.2)

## 2024-01-24 LAB — COMPREHENSIVE METABOLIC PANEL WITH GFR
ALT: 9 U/L (ref 0–44)
AST: 12 U/L — ABNORMAL LOW (ref 15–41)
Albumin: 3 g/dL — ABNORMAL LOW (ref 3.5–5.0)
Alkaline Phosphatase: 90 U/L (ref 38–126)
Anion gap: 9 (ref 5–15)
BUN: 9 mg/dL (ref 6–20)
CO2: 17 mmol/L — ABNORMAL LOW (ref 22–32)
Calcium: 8.5 mg/dL — ABNORMAL LOW (ref 8.9–10.3)
Chloride: 110 mmol/L (ref 98–111)
Creatinine, Ser: 0.44 mg/dL (ref 0.44–1.00)
GFR, Estimated: >60 mL/min (ref >60)
Glucose, Bld: 92 mg/dL (ref 70–99)
Potassium: 3.7 mmol/L (ref 3.5–5.1)
Sodium: 136 mmol/L (ref 135–145)
Total Bilirubin: 0.9 mg/dL (ref 0.0–1.2)
Total Protein: 6.9 g/dL (ref 6.5–8.1)

## 2024-01-24 LAB — GLUCOSE, CAPILLARY
Glucose-Capillary: 78 mg/dL (ref 70–99)
Glucose-Capillary: 110 mg/dL — ABNORMAL HIGH (ref 70–99)

## 2024-01-24 LAB — TYPE AND SCREEN
ABO/RH(D): AB POS
Antibody Screen: NEGATIVE

## 2024-01-24 MED ORDER — LIDOCAINE HCL (PF) 1 % IJ SOLN
INTRAMUSCULAR | Status: AC
  Filled 2024-01-24: qty 30

## 2024-01-24 MED ORDER — SOD CITRATE-CITRIC ACID 500-334 MG/5ML PO SOLN
30.0000 mL | ORAL | Status: DC | PRN
Start: 2024-01-24 — End: 2024-01-25

## 2024-01-24 MED ORDER — LIDOCAINE-EPINEPHRINE (PF) 1.5 %-1:200000 IJ SOLN
INTRAMUSCULAR | Status: DC | PRN
  Administered 2024-01-24: 3 mL via EPIDURAL

## 2024-01-24 MED ORDER — TERBUTALINE SULFATE 1 MG/ML IJ SOLN
0.2500 mg | Freq: Once | INTRAMUSCULAR | Status: AC
  Administered 2024-01-24: 0.25 mg via SUBCUTANEOUS
  Filled 2024-01-24: qty 1

## 2024-01-24 MED ORDER — EPHEDRINE 5 MG/ML INJ: 10.0000 mg | INTRAVENOUS | Status: DC | PRN

## 2024-01-24 MED ORDER — OXYTOCIN 10 UNIT/ML IJ SOLN
INTRAMUSCULAR | Status: AC
  Filled 2024-01-24: qty 2

## 2024-01-24 MED ORDER — TERBUTALINE SULFATE 1 MG/ML IJ SOLN: 0.2500 mg | Freq: Once | INTRAMUSCULAR | Status: DC | PRN

## 2024-01-24 MED ORDER — MISOPROSTOL 200 MCG PO TABS
ORAL_TABLET | ORAL | Status: AC
  Filled 2024-01-24: qty 4

## 2024-01-24 MED ORDER — HYDROXYZINE HCL 25 MG PO TABS: 50.0000 mg | ORAL_TABLET | Freq: Four times a day (QID) | ORAL | Status: DC | PRN

## 2024-01-24 MED ORDER — ONDANSETRON HCL 4 MG/2ML IJ SOLN: 4.0000 mg | Freq: Four times a day (QID) | INTRAMUSCULAR | Status: DC | PRN

## 2024-01-24 MED ORDER — FENTANYL CITRATE (PF) 100 MCG/2ML IJ SOLN
50.0000 ug | INTRAMUSCULAR | Status: DC | PRN
  Administered 2024-01-24 (×4): 100 ug via INTRAVENOUS
  Filled 2024-01-24 (×5): qty 2

## 2024-01-24 MED ORDER — LACTATED RINGERS IV SOLN: INTRAVENOUS | Status: DC

## 2024-01-24 MED ORDER — LACTATED RINGERS IV SOLN: 500.0000 mL | INTRAVENOUS | Status: DC | PRN

## 2024-01-24 MED ORDER — ACETAMINOPHEN 325 MG PO TABS: 650.0000 mg | ORAL_TABLET | ORAL | Status: DC | PRN

## 2024-01-24 MED ORDER — LACTATED RINGERS IV SOLN: 500.0000 mL | Freq: Once | INTRAVENOUS | Status: DC

## 2024-01-24 MED ORDER — OXYTOCIN-SODIUM CHLORIDE 30-0.9 UT/500ML-% IV SOLN
2.5000 [IU]/h | INTRAVENOUS | Status: DC
  Administered 2024-01-25: 2.5 [IU]/h via INTRAVENOUS
  Filled 2024-01-24: qty 500

## 2024-01-24 MED ORDER — PHENYLEPHRINE 80 MCG/ML (10ML) SYRINGE FOR IV PUSH (FOR BLOOD PRESSURE SUPPORT): 80.0000 ug | PREFILLED_SYRINGE | INTRAVENOUS | Status: DC | PRN

## 2024-01-24 MED ORDER — AMMONIA AROMATIC IN INHA
RESPIRATORY_TRACT | Status: AC
  Filled 2024-01-24: qty 10

## 2024-01-24 MED ORDER — DIPHENHYDRAMINE HCL 50 MG/ML IJ SOLN: 12.5000 mg | INTRAMUSCULAR | Status: DC | PRN

## 2024-01-24 MED ORDER — LIDOCAINE HCL (PF) 1 % IJ SOLN
INTRAMUSCULAR | Status: DC | PRN
  Administered 2024-01-24 (×3): 3 mL via SUBCUTANEOUS

## 2024-01-24 MED ORDER — SODIUM CHLORIDE 0.9 % IV SOLN
5.0000 10*6.[IU] | Freq: Once | INTRAVENOUS | Status: AC
  Administered 2024-01-24: 5 10*6.[IU] via INTRAVENOUS
  Filled 2024-01-24: qty 5

## 2024-01-24 MED ORDER — LIDOCAINE HCL (PF) 1 % IJ SOLN
30.0000 mL | INTRAMUSCULAR | Status: DC | PRN
  Filled 2024-01-24: qty 30

## 2024-01-24 MED ORDER — PENICILLIN G POT IN DEXTROSE 60000 UNIT/ML IV SOLN
3.0000 10*6.[IU] | INTRAVENOUS | Status: DC
  Administered 2024-01-25 (×2): 3 10*6.[IU] via INTRAVENOUS
  Filled 2024-01-24 (×2): qty 50

## 2024-01-24 MED ORDER — MISOPROSTOL 25 MCG QUARTER TABLET
25.0000 ug | ORAL_TABLET | ORAL | Status: DC | PRN
  Administered 2024-01-24 (×2): 25 ug via VAGINAL
  Filled 2024-01-24 (×2): qty 1

## 2024-01-24 MED ORDER — OXYTOCIN BOLUS FROM INFUSION
333.0000 mL | Freq: Once | INTRAVENOUS | Status: AC
  Administered 2024-01-25: 333 mL via INTRAVENOUS

## 2024-01-24 MED ORDER — BUPIVACAINE HCL (PF) 0.25 % IJ SOLN
INTRAMUSCULAR | Status: DC | PRN
  Administered 2024-01-24 (×2): 4 mL via EPIDURAL

## 2024-01-24 MED ORDER — FENTANYL-BUPIVACAINE-NACL 0.5-0.125-0.9 MG/250ML-% EP SOLN
12.0000 mL/h | EPIDURAL | Status: DC | PRN
  Administered 2024-01-24: 12 mL/h via EPIDURAL
  Filled 2024-01-24: qty 250

## 2024-01-24 MED ORDER — OXYTOCIN-SODIUM CHLORIDE 30-0.9 UT/500ML-% IV SOLN
1.0000 m[IU]/min | INTRAVENOUS | Status: DC
  Administered 2024-01-24 – 2024-01-25 (×2): 2 m[IU]/min via INTRAVENOUS
  Filled 2024-01-24: qty 500

## 2024-01-24 NOTE — Progress Notes (Signed)
 LABOR NOTE   Tara Rose 33 y.o.GP@ at [redacted]w[redacted]d  SUBJECTIVE:  Mild discomfort with contractions Analgesia: Labor support without medications  OBJECTIVE:  BP 132/83 (BP Location: Left Arm)   Pulse (!) 101   Temp 98.7 F (37.1 C) (Oral)   Resp 18   Ht 5' 1 (1.549 m)   Wt 90.5 kg   LMP 05/04/2023 (Approximate)   BMI 37.70 kg/m  No intake/output data recorded.  She has shown cervical change. CERVIX: 5 cm:  60%:   -2:   posterior:   firm SVE:   Dilation: 5 Effacement (%): 60 Station: -3 Exam by:: A Ruth Tully CNM CONTRACTIONS: regular, every 2-5 minutes FHR: Fetal heart tracing reviewed. Baseline: 130 bpm, Variability: Good {> 6 bpm), Accelerations: Reactive, and Decelerations: Absent Category I    Labs: Lab Results  Component Value Date   WBC 10.1 01/24/2024   HGB 11.9 (L) 01/24/2024   HCT 35.0 (L) 01/24/2024   MCV 85.6 01/24/2024   PLT 175 01/24/2024    ASSESSMENT: 1) Labor curve reviewed.       Progress: Early latent labor.     Membranes: ruptured, clear fluid        Principal Problem:   Labor and delivery, indication for care   PLAN: Start pitocin and antibiotics for GBS treatment.    [name], CNM  01/24/2024 9:06 PM

## 2024-01-24 NOTE — Progress Notes (Signed)
 Patient is a G1P0 at [redacted]w[redacted]d who presents to unit for scheduled version. Patient reports +fetal movement. Patient denies contractions, vaginal bleeding, and leakage of fluid. External monitors applied and assessing. Initial FHT 135. Vital signs WDL.

## 2024-01-24 NOTE — Progress Notes (Signed)
 LABOR NOTE   Tara Rose 33 y.o.GP@ at [redacted]w[redacted]d  SUBJECTIVE:  Pt having mild cramping Analgesia: Labor support without medications  OBJECTIVE:  BP 122/79   Pulse 96   Temp 99.4 F (37.4 C) (Oral)   Resp 16   Ht 5' 1 (1.549 m)   Wt 90.5 kg   LMP 05/04/2023 (Approximate)   BMI 37.70 kg/m  No intake/output data recorded.  She has shown cervical change. CERVIX: 2-3:  60%:   -2:   posterior:   firm SVE:   Dilation: 2.5 Effacement (%): 60 Station: -3 Exam by:: D. Sickles, RN CONTRACTIONS: irregular, every 2-6 minutes FHR: Fetal heart tracing reviewed. Baseline: 135 bpm, Variability: Good {> 6 bpm), Accelerations: Reactive, and Decelerations: Absent Category I    Labs: Lab Results  Component Value Date   WBC 10.1 01/24/2024   HGB 11.9 (L) 01/24/2024   HCT 35.0 (L) 01/24/2024   MCV 85.6 01/24/2024   PLT 175 01/24/2024    ASSESSMENT: 1) Labor curve reviewed.       Progress: Not in labor.     Membranes: intact            Principal Problem:   Labor and delivery, indication for care   PLAN: Discussed use of cooks catheter to help with labor progress. Reviewed procedure risks and benefits. Pt and her partner are in agreement to placement. Cooks catheter placed with ease. Discussed use of pitocin at 2100 if needed. She is in agreement.   [name], CNM  01/24/2024 7:01 PM

## 2024-01-24 NOTE — H&P (Signed)
 History and Physical   HPI  Tara Rose is a 33 y.o. G1P0000 at [redacted]w[redacted]d Estimated Date of Delivery: 02/12/24 who is being admitted for external cephalic version with induction or cesarean delivery. She has history significant for A 2 Gestational diabetes being treated with Metformin .    OB History  OB History  Gravida Para Term Preterm AB Living  1 0 0 0 0 0  SAB IAB Ectopic Multiple Live Births  0 0 0 0 0    # Outcome Date GA Lbr Len/2nd Weight Sex Type Anes PTL Lv  1 Current             PROBLEM LIST  Pregnancy complications or risks: Patient Active Problem List   Diagnosis Date Noted   Labor and delivery, indication for care 01/24/2024   Breech presentation 01/18/2024   Pre-existing type 2 diabetes mellitus during pregnancy, antepartum 11/19/2023   Obesity in pregnancy 11/19/2023   Supervision of high risk pregnancy in third trimester 07/02/2023   Leucocytosis 11/24/2022    Prenatal labs and studies: ABO, Rh: AB/Positive/-- 08/02/2023 1609) Antibody: Negative (08/04 1025) Rubella: <0.90 August 02, 2023 1609) RPR: Non Reactive (08/04 1025)  HBsAg: Negative (08/04 1025)  HIV: Non Reactive (08/04 1025)  HAD:Endpupcz/-- (10/01 1547)   Past Medical History:  Diagnosis Date   Mixed hyperlipidemia 11/20/2022   Type 2 diabetes mellitus (HCC)      Past Surgical History:  Procedure Laterality Date   WISDOM TOOTH EXTRACTION       Medications    Current Discharge Medication List     CONTINUE these medications which have NOT CHANGED   Details  aspirin EC 81 MG tablet Take 81 mg by mouth daily. Swallow whole.    glucose blood (ACCU-CHEK GUIDE TEST) test strip 1 each by Other route 4 (four) times daily. Check blood glucose fasting & 1h postprandial Qty: 200 strip, Refills: 6    metFORMIN  (GLUCOPHAGE -XR) 500 MG 24 hr tablet Take 2 tablets (1,000 mg total) by mouth at bedtime. Qty: 180 tablet, Refills: 0   Associated Diagnoses: Pre-existing type 2 diabetes  mellitus during pregnancy, antepartum    Prenatal Vit-Fe Fumarate-FA (PRENATAL PO) Take by mouth.         Allergies  Lactose intolerance (gi)  Review of Systems  Constitutional: negative Eyes: negative Ears, nose, mouth, throat, and face: negative Respiratory: negative Cardiovascular: negative Gastrointestinal: negative Genitourinary:negative Integument/breast: negative Hematologic/lymphatic: negative Musculoskeletal:negative Neurological: negative Behavioral/Psych: negative Endocrine: negative Allergic/Immunologic: negative  Physical Exam  BP 133/83   Pulse (!) 104   Temp 98.5 F (36.9 C) (Oral)   Resp 15   LMP 05/04/2023 (Approximate)   Lungs:  CTA B Cardio: RRR without M/R/G Abd: Soft, gravid, NT Presentation: breech  EXT: No C/C/ 1+ Edema DTRs: 2+ B CERVIX:  to evaluate if version successful.   See Prenatal records for more detailed PE.     FHR:  Baseline: 140 bpm, Variability: Good {> 6 bpm), Accelerations: Reactive, and Decelerations: Absent  Toco: Uterine Contractions: irritability  Test Results  No results found for this or any previous visit (from the past 24 hours). Group B Strep positive  Assessment   G1P0000 at [redacted]w[redacted]d Estimated Date of Delivery: 02/12/24  The fetus is reassuring.   Patient Active Problem List   Diagnosis Date Noted   Labor and delivery, indication for care 01/24/2024   Breech presentation 01/18/2024   Pre-existing type 2 diabetes mellitus during pregnancy, antepartum 11/19/2023   Obesity in  pregnancy 11/19/2023   Supervision of high risk pregnancy in third trimester 07/02/2023   Leucocytosis 11/24/2022    Plan  1. Admit to L&D :   2. EFM:- Category 1 3.pain medication prn for procedure  4. Admission labs  5. Dr. Leigh notified of pt arrival.   [name], [facility]  01/24/2024 10:23 AM

## 2024-01-24 NOTE — Anesthesia Preprocedure Evaluation (Signed)
 Anesthesia Evaluation  Patient identified by MRN, date of birth, ID band Patient awake    Reviewed: Allergy & Precautions, NPO status , Patient's Chart, lab work & pertinent test results  Airway Mallampati: III  TM Distance: >3 FB Neck ROM: full    Dental  (+) Chipped   Pulmonary neg pulmonary ROS, former smoker   Pulmonary exam normal        Cardiovascular Exercise Tolerance: Good (-) hypertensionnegative cardio ROS Normal cardiovascular exam     Neuro/Psych    GI/Hepatic negative GI ROS,,,  Endo/Other  diabetes    Renal/GU   negative genitourinary   Musculoskeletal   Abdominal   Peds  Hematology negative hematology ROS (+)   Anesthesia Other Findings Past Medical History: 11/20/2022: Mixed hyperlipidemia No date: Type 2 diabetes mellitus (HCC)  Past Surgical History: No date: WISDOM TOOTH EXTRACTION  BMI    Body Mass Index: 37.70 kg/m      Reproductive/Obstetrics (+) Pregnancy                              Anesthesia Physical Anesthesia Plan  ASA: 2  Anesthesia Plan: Epidural   Post-op Pain Management:    Induction:   PONV Risk Score and Plan:   Airway Management Planned: Natural Airway  Additional Equipment:   Intra-op Plan:   Post-operative Plan:   Informed Consent: I have reviewed the patients History and Physical, chart, labs and discussed the procedure including the risks, benefits and alternatives for the proposed anesthesia with the patient or authorized representative who has indicated his/her understanding and acceptance.     Dental Advisory Given  Plan Discussed with: Anesthesiologist  Anesthesia Plan Comments: (Patient reports no bleeding problems and no anticoagulant use.   Patient consented for risks of anesthesia including but not limited to:  - adverse reactions to medications - risk of bleeding, infection and or nerve damage from epidural  that could lead to paralysis - risk of headache or failed epidural - nerve damage due to positioning - that if epidural is used for C-section that there is a chance of epidural failure requiring spinal placement or conversion to GA - Damage to heart, brain, lungs, other parts of body or loss of life  Patient voiced understanding and assent.)        Anesthesia Quick Evaluation

## 2024-01-24 NOTE — Progress Notes (Signed)
 LABOR NOTE   Tara Rose 32 y.o.GP@ at [redacted]w[redacted]d DR. Roby at the bedside of version. Consent completed. Pre procedural medication given.  ON exam with bedside u/s fetus no in vertex position. No version needed.   SUBJECTIVE:  Pt is anxious about procedure.  Analgesia:  IV pain medication given for version.   OBJECTIVE:  BP 126/68   Pulse 100   Temp 98.3 F (36.8 C) (Oral)   Resp 16   Ht 5' 1 (1.549 m)   Wt 90.5 kg   LMP 05/04/2023 (Approximate)   BMI 37.70 kg/m  Total I/O In: 240 [P.O.:240] Out: -   CERVIX: 2 cm:  60%:   -3:   :    SVE:   Dilation: 2 Effacement (%): 60 Station: -3 Exam by:: D.Sickles, RN CONTRACTIONS: none FHR: Fetal heart tracing reviewed. Baseline: 140 bpm, Variability: Good {> 6 bpm), Accelerations: Reactive, and Decelerations: Absent Category I    Labs: Lab Results  Component Value Date   WBC 10.1 01/24/2024   HGB 11.9 (L) 01/24/2024   HCT 35.0 (L) 01/24/2024   MCV 85.6 01/24/2024   PLT 175 01/24/2024    ASSESSMENT: 1) Labor curve reviewed.       Progress: Not in labor.     Membranes: intact             Principal Problem:   Labor and delivery, indication for care A2 GDM, induction  PLAN: Proceed with induction. Discussed use of cytotec. Pt in agreement. Orders placed.   [name], CNM  01/24/2024 2:46 PM

## 2024-01-24 NOTE — Inpatient Diabetes Management (Signed)
 Inpatient Diabetes Program Recommendations  ADA Standards of Care 2023 Diabetes in Pregnancy Target Glucose Ranges:  Fasting: 70 - 95 mg/dL 1 hr postprandial:  889 - 140mg /dL (from first bite of meal) 2 hr postprandial:  100 - 120 mg/dL (from first bit of meal)   Review of Glycemic Control  Diabetes history: GDM  Outpatient Diabetes medications: Metformin  1,000mg  daily   Current orders for Inpatient glycemic control: none  Inpatient Diabetes Program Recommendations:   - Please consider starting Novolog 0-14 units q4hrs.   - If 2 consecutive blood sugar readings are >120 mg/dl with Novolog correction, consider initiating IV insulin .   Thanks,  Lavanda Search, RN, MSN, [facility]  Inpatient Diabetes Coordinator  Pager [phone] (8a-5p)

## 2024-01-24 NOTE — Anesthesia Procedure Notes (Signed)
 Epidural Patient location during procedure: OB Start time: 01/24/2024 11:14 PM End time: 01/24/2024 11:31 PM  Staffing Anesthesiologist: Naod Sweetland, Fairy POUR, MD Performed: anesthesiologist   Preanesthetic Checklist Completed: patient identified, IV checked, site marked, risks and benefits discussed, surgical consent, monitors and equipment checked, pre-op evaluation and timeout performed  Epidural Patient position: sitting Prep: ChloraPrep Patient monitoring: heart rate, continuous pulse ox and blood pressure Approach: midline Location: L3-L4 Injection technique: LOR saline  Needle:  Needle type: Tuohy  Needle gauge: 17 G Needle length: 9 cm and 9 Needle insertion depth: 6 cm Catheter type: closed end flexible Catheter size: 19 Gauge Catheter at skin depth: 12 cm Test dose: negative and 1.5% lidocaine with Epi 1:200 K  Assessment Sensory level: T10 Events: blood not aspirated, no cerebrospinal fluid, injection not painful, no injection resistance, no paresthesia and negative IV test  Additional Notes 3 attempts. Transient left sided paresthesia with catheter advancement with first 2 attempts that resolved with catheter retraction. Right sided paramedian attempt utilized for final attempt.  Pt. Evaluated and documentation done after procedure finished. Patient identified. Risks/Benefits/Options discussed with patient including but not limited to bleeding, infection, nerve damage, paralysis, failed block, incomplete pain control, headache, blood pressure changes, nausea, vomiting, reactions to medication both or allergic, itching and postpartum back pain. Confirmed with bedside nurse the patient's most recent platelet count. Confirmed with patient that they are not currently taking any anticoagulation, have any bleeding history or any family history of bleeding disorders. Patient expressed understanding and wished to proceed. All questions were answered. Sterile technique was used  throughout the entire procedure. Please see nursing notes for vital signs. Test dose was given through epidural catheter and negative prior to continuing to dose epidural or start infusion. Warning signs of high block given to the patient including shortness of breath, tingling/numbness in hands, complete motor block, or any concerning symptoms with instructions to call for help. Patient was given instructions on fall risk and not to get out of bed. All questions and concerns addressed with instructions to call with any issues or inadequate analgesia.    Patient tolerated the insertion well without immediate complications.Reason for block:procedure for pain

## 2024-01-25 ENCOUNTER — Encounter: Payer: Self-pay | Admitting: Obstetrics

## 2024-01-25 DIAGNOSIS — O24425 Gestational diabetes mellitus in childbirth, controlled by oral hypoglycemic drugs: Secondary | ICD-10-CM | POA: Diagnosis not present

## 2024-01-25 DIAGNOSIS — Z3A37 37 weeks gestation of pregnancy: Secondary | ICD-10-CM | POA: Diagnosis not present

## 2024-01-25 LAB — GLUCOSE, CAPILLARY
Glucose-Capillary: 113 mg/dL — ABNORMAL HIGH (ref 70–99)
Glucose-Capillary: 98 mg/dL (ref 70–99)
Glucose-Capillary: 130 mg/dL — ABNORMAL HIGH (ref 70–99)

## 2024-01-25 LAB — RPR: RPR Ser Ql: NONREACTIVE

## 2024-01-25 MED ORDER — LACTATED RINGERS IV BOLUS
1000.0000 mL | Freq: Once | INTRAVENOUS | Status: AC
  Administered 2024-01-25: 1000 mL via INTRAVENOUS

## 2024-01-25 MED ORDER — DIBUCAINE (PERIANAL) 1 % EX OINT: 1.0000 | TOPICAL_OINTMENT | CUTANEOUS | Status: DC | PRN

## 2024-01-25 MED ORDER — PRENATAL PLUS 27-1 MG PO TABS
1.0000 | ORAL_TABLET | Freq: Every day | ORAL | Status: DC
  Filled 2024-01-25: qty 1

## 2024-01-25 MED ORDER — ZOLPIDEM TARTRATE 5 MG PO TABS: 5.0000 mg | ORAL_TABLET | Freq: Every evening | ORAL | Status: DC | PRN

## 2024-01-25 MED ORDER — TETANUS-DIPHTH-ACELL PERTUSSIS 5-2-15.5 LF-MCG/0.5 IM SUSP
0.5000 mL | Freq: Once | INTRAMUSCULAR | Status: DC
  Filled 2024-01-25: qty 0.5

## 2024-01-25 MED ORDER — WITCH HAZEL-GLYCERIN EX PADS: 1.0000 | MEDICATED_PAD | CUTANEOUS | Status: DC | PRN

## 2024-01-25 MED ORDER — METFORMIN HCL ER 500 MG PO TB24
500.0000 mg | ORAL_TABLET | ORAL | Status: AC
  Administered 2024-01-25: 500 mg via ORAL
  Filled 2024-01-25: qty 1

## 2024-01-25 MED ORDER — METFORMIN HCL 500 MG PO TABS: 500.0000 mg | ORAL_TABLET | ORAL | Status: DC

## 2024-01-25 MED ORDER — IBUPROFEN 600 MG PO TABS
600.0000 mg | ORAL_TABLET | Freq: Four times a day (QID) | ORAL | Status: DC
  Administered 2024-01-25 – 2024-01-26 (×4): 600 mg via ORAL
  Filled 2024-01-25 (×5): qty 1

## 2024-01-25 MED ORDER — PRENATAL MULTIVITAMIN CH: 1.0000 | ORAL_TABLET | Freq: Every day | ORAL | Status: DC

## 2024-01-25 MED ORDER — ONDANSETRON HCL 4 MG PO TABS: 4.0000 mg | ORAL_TABLET | ORAL | Status: DC | PRN

## 2024-01-25 MED ORDER — METFORMIN HCL ER 500 MG PO TB24
1000.0000 mg | ORAL_TABLET | Freq: Every day | ORAL | Status: DC
  Filled 2024-01-25: qty 2

## 2024-01-25 MED ORDER — LACTATED RINGERS IV SOLN: INTRAVENOUS | Status: DC

## 2024-01-25 MED ORDER — ONDANSETRON HCL 4 MG/2ML IJ SOLN: 4.0000 mg | INTRAMUSCULAR | Status: DC | PRN

## 2024-01-25 MED ORDER — BENZOCAINE-MENTHOL 20-0.5 % EX AERO
1.0000 | INHALATION_SPRAY | CUTANEOUS | Status: DC | PRN
  Filled 2024-01-25: qty 56

## 2024-01-25 MED ORDER — MEASLES, MUMPS & RUBELLA VAC ~~LOC~~ SUSR
0.5000 mL | Freq: Once | SUBCUTANEOUS | Status: DC
  Filled 2024-01-25: qty 0.5

## 2024-01-25 MED ORDER — ACETAMINOPHEN 325 MG PO TABS
650.0000 mg | ORAL_TABLET | ORAL | Status: DC | PRN
  Filled 2024-01-25: qty 2

## 2024-01-25 MED ORDER — DIPHENHYDRAMINE HCL 25 MG PO CAPS: 25.0000 mg | ORAL_CAPSULE | Freq: Four times a day (QID) | ORAL | Status: DC | PRN

## 2024-01-25 MED ORDER — SENNOSIDES-DOCUSATE SODIUM 8.6-50 MG PO TABS
2.0000 | ORAL_TABLET | Freq: Every day | ORAL | Status: DC
  Filled 2024-01-25: qty 2

## 2024-01-25 MED ORDER — SIMETHICONE 80 MG PO CHEW: 80.0000 mg | CHEWABLE_TABLET | ORAL | Status: DC | PRN

## 2024-01-25 MED ORDER — COCONUT OIL OIL
1.0000 | TOPICAL_OIL | Status: DC | PRN
  Filled 2024-01-25: qty 7.5

## 2024-01-25 NOTE — Progress Notes (Signed)
 LABOR NOTE   Tara Rose 32 y.o.GP@ at [redacted]w[redacted]d  SUBJECTIVE:  Comfortable with epidural placement Analgesia: Epidural  OBJECTIVE:  BP (!) 101/55   Pulse (!) 107   Temp 99 F (37.2 C) (Oral)   Resp 16   Ht 5' 1 (1.549 m)   Wt 90.5 kg   LMP 05/04/2023 (Approximate)   SpO2 97%   BMI 37.70 kg/m  No intake/output data recorded.  She has shown cervical change. CERVIX: 7-8cm:  90%:   -2:   mid position:   soft SVE:   Dilation: 7.5 Effacement (%): 70 Station: -2 Exam by:: A Kaitlynn Tramontana CNM CONTRACTIONS: regular, every 2-4 minutes FHR: Fetal heart tracing reviewed. Baseline: 150 bpm, Variability: Good {> 6 bpm), Accelerations: Reactive, and Decelerations: Absent Category I  Nurse called with concerns for variable with an occasional late. She had turned pitocin off.   Labs: Lab Results  Component Value Date   WBC 10.1 01/24/2024   HGB 11.9 (L) 01/24/2024   HCT 35.0 (L) 01/24/2024   MCV 85.6 01/24/2024   PLT 175 01/24/2024    ASSESSMENT: 1) Labor curve reviewed.       Progress: Active phase labor.     Membranes: ruptured, clear fluid     IUPC placed, amnioinfusion started       Principal Problem:   Labor and delivery, indication for care A2 GDM  PLAN: place IUPC Amnioinfusion ordered. Restart pitocin and titrate to adequate 200-250 mvu's  [name], CNM  01/25/2024 3:29 AM

## 2024-01-25 NOTE — Discharge Summary (Signed)
 Postpartum Discharge Summary  Date of Service updated 01/26/24     Patient Name: Tara Rose DOB: 1990-07-01 MRN: [id number]  Date of admission: 01/24/2024 Delivery date:01/25/2024 Delivering provider: SEBASTIAN SHAM Date of discharge: 01/26/2024  Admitting diagnosis: Labor and delivery, indication for care [O75.9] Intrauterine pregnancy: [redacted]w[redacted]d     Secondary diagnosis:  Active Problems:   SVD (spontaneous vaginal delivery) Gestational diabetes on Metformin    Additional problems: none    Discharge diagnosis: Term Pregnancy Delivered and GDM A2                                              Post partum procedures:IV Fe infusion Augmentation: AROM, Pitocin, Cytotec, and cooks catheter Complications: None  Hospital course: Induction of Labor With Vaginal Delivery   33 y.o. yo G1P1001 at [redacted]w[redacted]d was admitted to the hospital 01/24/2024 for induction of labor.  Indication for induction: A2 DM.  Patient had an labor course uncomplicated. Membrane Rupture Time/Date: 9:02 PM,01/24/2024  Delivery Method:Vaginal, Spontaneous Operative Delivery:N/A Episiotomy:  none Lacerations:  1st degree1st degree vaginal and perineal  Details of delivery can be found in separate delivery note.     Pt. Is eating, hydrating, and voiding regularly without difficulty. Has yet to have BM. She is strictly bottle feeding with formula. Reports small amount of vaginal bleeding, denies passing large blood clots. Has had cramping abdomen pain relieved with tylenol /ibuprofen. No plans for contraception, would like to have another baby and states it took 12 years to conceive this baby. Denies anxiety/depression symptoms. Endorses good support from partner and family.     Newborn Data: Birth date:01/25/2024 Birth time:8:10 AM Gender:Female Living status:Livingliving Apgars:9 ,9  Weight:3370 g  Magnesium Sulfate received: No BMZ received: No Rhophylac:N/A MMR: patient declined T-DaP:declined Flu:  declined RSV Vaccine received: declined Transfusion:No Had IV infusoin Immunizations administered: Immunization History  Administered Date(s) Administered   Influenza-Unspecified 11/16/2018    Physical exam  Vitals:   01/25/24 1524 01/25/24 2120 01/25/24 2330 01/26/24 0815  BP: 103/71 104/75 (!) 96/57 95/62  Pulse: (!) 103 100 (!) 103 (!) 103  Resp: 20 18 20 18   Temp: 99 F (37.2 C) 98.7 F (37.1 C) 98.4 F (36.9 C) 98 F (36.7 C)  TempSrc: Oral Oral Oral Oral  SpO2: 98% 99% 100% 100%  Weight:      Height:       General: alert, cooperative, and no distress Lochia: appropriate Uterine Fundus: firm Incision: N/A DVT Evaluation: No evidence of DVT seen on physical exam. Labs: Lab Results  Component Value Date   WBC 12.5 (H) 01/26/2024   HGB 8.3 (L) 01/26/2024   HCT 24.4 (L) 01/26/2024   MCV 87.8 01/26/2024   PLT 159 01/26/2024      Latest Ref Rng & Units 01/24/2024   11:10 AM  CMP  Glucose 70 - 99 mg/dL 92   BUN 6 - 20 mg/dL 9   Creatinine 9.55 - 8.99 mg/dL 9.55   Sodium 864 - 854 mmol/L 136   Potassium 3.5 - 5.1 mmol/L 3.7   Chloride 98 - 111 mmol/L 110   CO2 22 - 32 mmol/L 17   Calcium 8.9 - 10.3 mg/dL 8.5   Total Protein 6.5 - 8.1 g/dL 6.9   Total Bilirubin 0.0 - 1.2 mg/dL 0.9   Alkaline Phos 38 - 126 U/L 90   AST  15 - 41 U/L 12   ALT 0 - 44 U/L 9    Edinburgh Score:    01/25/2024    9:30 PM  Edinburgh Postnatal Depression Scale Screening Tool  I have been able to laugh and see the funny side of things. 0  I have looked forward with enjoyment to things. 0  I have blamed myself unnecessarily when things went wrong. 0  I have been anxious or worried for no good reason. 0  I have felt scared or panicky for no good reason. 0  Things have been getting on top of me. 0  I have been so unhappy that I have had difficulty sleeping. 0  I have felt sad or miserable. 0  I have been so unhappy that I have been crying. 0  The thought of harming myself has  occurred to me. 0  Edinburgh Postnatal Depression Scale Total 0      After visit meds:  Allergies as of 01/26/2024       Reactions   Lactose Intolerance (gi) Diarrhea        Medication List     TAKE these medications    Accu-Chek Guide Test test strip Generic drug: glucose blood 1 each by Other route 4 (four) times daily. Check blood glucose fasting & 1h postprandial   metFORMIN  500 MG 24 hr tablet Commonly known as: GLUCOPHAGE -XR Take 2 tablets (1,000 mg total) by mouth at bedtime.   PRENATAL PO Take by mouth.         Discharge home in stable condition Infant Feeding: Bottle Infant Disposition:home with mother Discharge instruction: per After Visit Summary and Postpartum booklet. Activity: Advance as tolerated. Pelvic rest for 6 weeks.  Diet: carb modified diet Anticipated Birth Control: none Postpartum Appointment:schedule a two week telehealth visit and six week in person postpartum visit Additional Postpartum F/U: 2 hour GTT Future Appointments:No future appointments. Follow up Visit:  Follow-up Information     [facility] Follow up.   Specialty: Obstetrics and Gynecology Why: call to schedule a two week telehealth visit and six week in person postpartum visit Contact information: [address] [place]  [phone]                    01/26/2024 Lolita Loots, CNM

## 2024-01-25 NOTE — Inpatient Diabetes Management (Signed)
 Inpatient Diabetes Program Recommendations  Diabetes Treatment Program Recommendations  ADA Standards of Care Diabetes in Pregnancy Target Glucose Ranges:  Fasting: 70 - 95 mg/dL 1 hr postprandial: Less than 140mg /dL (from first bite of meal) 2 hr postprandial: Less than 120 mg/dL (from first bite of meal)     Latest Reference Range & Units 01/24/24 17:08 01/24/24 21:18 01/25/24 01:50 01/25/24 05:44  Glucose-Capillary 70 - 99 mg/dL 78 889 (H) 886 (H) 98   Review of Glycemic Control  Diabetes history: GDM; [redacted]W[redacted]D Outpatient Diabetes medications: Metformin  1000 mg daily Current orders for Inpatient glycemic control: CBGs Q4H; one time Metformin  500 mg given at 4:34 am 01/25/24  Inpatient Diabetes Program Recommendations:    Insulin : May want to consider ordering Novolog 0-14 units Q4H during labor. Once delivered, anticipate patient's glucose to return to prepregnancy baseline and not require any medication for glycemic management.  Thanks, Earnie Gainer, RN, MSN, [facility] Diabetes Coordinator Inpatient Diabetes Program [phone] (Team Pager from 8am to 5pm)

## 2024-01-25 NOTE — Lactation Note (Signed)
 This note was copied from a baby's chart. Lactation Consultation Note  Patient Name: Tara Rose Date: 01/25/2024 Age:31 hours Reason for consult: L&D Initial assessment;Primapara;1st time breastfeeding;Early term 37-38.6wks;Breastfeeding assistance;RN request   Maternal Data Has patient been taught Hand Expression?: Yes Does the patient have breastfeeding experience prior to this delivery?: No  Initial assessment w/ a 1hr old baby Tara and P1 patient.  This was a SVD.  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  Mom verbalized that she has [employer] breastpump.  Feeding Mother's Current Feeding Choice: Breast Milk  Mom attempting to latch infant to the rt breast in football hold.  Infant not cueing and sleep.  LC placed infant STS for about 20 minutes and then reattempted latching to the left breast.  Several attempts were made but infant showed no interest in eating. Infant placed back STS.  Mom has large pliable breast with short erect nipples.  Interventions Interventions: Breast feeding basics reviewed;Assisted with latch;Skin to skin;Breast compression;Adjust position;Support pillows;Position options;Education  LC provided education on the following;  milk production expectations, hunger cues, day 1/2 wet/dirty diapers, hand expression, cluster feeding, benefits of STS and arousing infant for a feeding.  Lactation informed patient of feeding infant at least 8 or more times w/in a 24hr period but not exceeding 3hrs. Patient verbalized understanding.   Discharge Pump: Personal;DEBP ([employer]) WIC Program: No  Consult Status Consult Status: Follow-up Follow-up type: In-patient    Tara Rose S Voncille Simm 01/25/2024, 10:34 AM

## 2024-01-25 NOTE — Progress Notes (Signed)
 LABOR NOTE   Tara Rose 32 y.o.GP@ at [redacted]w[redacted]d  SUBJECTIVE:  Resting , denies urge to push at this time. Notes she had some pressure while she was sleeping  Analgesia: Epidural  OBJECTIVE:  BP 119/68   Pulse 94   Temp 99.2 F (37.3 C) (Oral)   Resp 18   Ht 5' 1 (1.549 m)   Wt 90.5 kg   LMP 05/04/2023 (Approximate)   SpO2 96%   BMI 37.70 kg/m  Total I/O In: -  Out: 1650 [Urine:1650]  She has shown cervical change. CERVIX: 10cm:  100%:   -2:   :    SVE:   Dilation: 10 Effacement (%): 100 Station: -2 Exam by:: A Keonia Pasko CNM CONTRACTIONS: regular, every 2-3 minutes FHR: Fetal heart tracing reviewed. Baseline: 125 bpm, Variability: Good {> 6 bpm), Accelerations: Reactive, and Decelerations: Absent Category I    Labs: Lab Results  Component Value Date   WBC 10.1 01/24/2024   HGB 11.9 (L) 01/24/2024   HCT 35.0 (L) 01/24/2024   MCV 85.6 01/24/2024   PLT 175 01/24/2024    ASSESSMENT: 1) Labor curve reviewed.       Progress: Active phase labor.     Membranes: ruptured, IUPC      Principal Problem:   Labor and delivery, indication for care A2 GDM  PLAN: Start pushing, anticipate NSVD    [name], CNM  01/25/2024 6:45 AM

## 2024-01-26 LAB — CBC
HCT: 24.4 % — ABNORMAL LOW (ref 36.0–46.0)
Hemoglobin: 8.3 g/dL — ABNORMAL LOW (ref 12.0–15.0)
MCH: 29.9 pg (ref 26.0–34.0)
MCHC: 34 g/dL (ref 30.0–36.0)
MCV: 87.8 fL (ref 80.0–100.0)
Platelets: 159 K/uL (ref 150–400)
RBC: 2.78 MIL/uL — ABNORMAL LOW (ref 3.87–5.11)
RDW: 14.1 % (ref 11.5–15.5)
WBC: 12.5 K/uL — ABNORMAL HIGH (ref 4.0–10.5)
nRBC: 0 % (ref 0.0–0.2)

## 2024-01-26 LAB — GLUCOSE, CAPILLARY: Glucose-Capillary: 89 mg/dL (ref 70–99)

## 2024-01-26 MED ORDER — IRON SUCROSE 500 MG IVPB - SIMPLE MED
500.0000 mg | Freq: Once | INTRAVENOUS | Status: AC
  Administered 2024-01-26: 500 mg via INTRAVENOUS
  Filled 2024-01-26: qty 500

## 2024-01-26 MED ORDER — EPINEPHRINE 0.3 MG/0.3ML IJ SOAJ: 0.3000 mg | Freq: Once | INTRAMUSCULAR | Status: DC | PRN

## 2024-01-26 MED ORDER — METHYLPREDNISOLONE SODIUM SUCC 125 MG IJ SOLR: 125.0000 mg | Freq: Once | INTRAMUSCULAR | Status: DC | PRN

## 2024-01-26 MED ORDER — DIPHENHYDRAMINE HCL 50 MG/ML IJ SOLN: 25.0000 mg | Freq: Once | INTRAMUSCULAR | Status: DC | PRN

## 2024-01-26 MED ORDER — SODIUM CHLORIDE 0.9 % IV BOLUS
500.0000 mL | Freq: Once | INTRAVENOUS | Status: DC | PRN
Start: 2024-01-26 — End: 2024-01-26

## 2024-01-26 MED ORDER — SODIUM CHLORIDE 0.9 % IV SOLN: INTRAVENOUS | Status: DC | PRN

## 2024-01-26 MED ORDER — ALBUTEROL SULFATE (2.5 MG/3ML) 0.083% IN NEBU: 2.5000 mg | INHALATION_SOLUTION | Freq: Once | RESPIRATORY_TRACT | Status: DC | PRN

## 2024-01-26 NOTE — Lactation Note (Signed)
 This note was copied from a baby's chart. Lactation Consultation Note  Patient Name: Tara Rose Date: 01/26/2024 Age:31 hours Reason for consult: Follow-up assessment;Primapara;Breastfeeding assistance   Maternal Data Has patient been taught Hand Expression?: Yes Does the patient have breastfeeding experience prior to this delivery?: No  Feeding Mother's Current Feeding Choice: Breast Milk and Formula Nipple Type: Slow - flow  LATCH Score Latch: Too sleepy or reluctant, no latch achieved, no sucking elicited. (attempted with nipple shield, would not suck)  Audible Swallowing: None  Type of Nipple: Flat  Comfort (Breast/Nipple): Soft / non-tender  Hold (Positioning): Assistance needed to correctly position infant at breast and maintain latch.  LATCH Score: 4   Lactation Tools Discussed/Used Tools: Nipple Shields Nipple shield size: 20 Breast pump type: Double-Electric Breast Pump (pt's own motif) Pump Education: Setup, frequency, and cleaning;Milk Storage Reason for Pumping: baby not latching well Pumping frequency: Q3h or 8x/24 hr if baby not latching to breast Pumped volume:  (drops)  Interventions Interventions: Breast feeding basics reviewed;Assisted with latch;Hand express;Adjust position;Breast compression;Reverse pressure;Support pillows;Coconut oil;Education  Discharge Pump: Personal WIC Program: No  Consult Status Consult Status: Follow-up Date: 01/26/24 Follow-up type: In-patient    [name] 01/26/2024, 12:05 PM

## 2024-01-26 NOTE — Anesthesia Postprocedure Evaluation (Signed)
 Anesthesia Post Note  Patient: Tara Rose  Procedure(s) Performed: AN AD HOC LABOR EPIDURAL  Patient location during evaluation: Mother Baby Anesthesia Type: Epidural Level of consciousness: awake and alert Pain management: pain level controlled Vital Signs Assessment: post-procedure vital signs reviewed and stable Respiratory status: spontaneous breathing, nonlabored ventilation and respiratory function stable Cardiovascular status: stable Postop Assessment: no headache, no backache, able to ambulate and adequate PO intake Anesthetic complications: no   No notable events documented.   Last Vitals:  Vitals:   01/25/24 2330 01/26/24 0815  BP: (!) 96/57 95/62  Pulse: (!) 103 (!) 103  Resp: 20 18  Temp: 36.9 C 36.7 C  SpO2: 100% 100%    Last Pain:  Vitals:   01/26/24 0815  TempSrc: Oral  PainSc: 0-No pain                 Prentice Murphy

## 2024-01-26 NOTE — Anesthesia Post-op Follow-up Note (Signed)
  Anesthesia Pain Follow-up Note  Patient: Tara Rose  Day #: 1  Date of Follow-up: 01/26/2024 Time: 9:49 AM  Last Vitals:  Vitals:   01/25/24 2330 01/26/24 0815  BP: (!) 96/57 95/62  Pulse: (!) 103 (!) 103  Resp: 20 18  Temp: 36.9 C 36.7 C  SpO2: 100% 100%    Level of Consciousness: alert  Pain: mild   Side Effects:None  Catheter Site Exam:clean, dry     Plan: D/C from anesthesia care at surgeon's request  Prentice Murphy

## 2024-02-04 ENCOUNTER — Encounter: Payer: Self-pay | Admitting: Certified Nurse Midwife

## 2024-02-06 NOTE — Patient Instructions (Signed)
 Postpartum Care After Vaginal Delivery The following information offers guidance about how to care for yourself from the time you deliver your baby to 6-12 weeks after delivery (postpartum period). If you have problems or questions, contact your health care provider for more specific instructions. Follow these instructions at home: Vaginal bleeding It is normal to have vaginal bleeding (lochia) after delivery. Wear a sanitary pad for bleeding and discharge. During the first week after delivery, the amount and appearance of lochia is often similar to a menstrual period. Over the next few weeks, it will gradually decrease to a dry, yellow-brown discharge. For most women, lochia stops completely by 4-6 weeks after delivery, but it can vary. Change your sanitary pads frequently. Watch for any changes in your flow, such as: An increase in bleeding. A change in color. Large blood clots. If you pass a blood clot the size of an egg or larger, contact your healthcare provider.. Do not use tampons or douches until your health care provider approves. If you are not breastfeeding, your period should return 6-8 weeks after delivery. If you are feeding your baby breast milk only, your period may not return until you stop breastfeeding. Perineal care  Keep the area between the vagina and the anus (perineum) clean and dry. Use medicated pads and pain-relieving sprays or creams as told. If you had a tear or a surgical cut in the perineum (episiotomy), check the area for signs of infection until you are healed. Check for: More redness, swelling, or pain. Pus or a bad smell. You may be given a squirt bottle to use instead of wiping to clean the perineum area after you use the bathroom. Pat the area gently to dry it. To relieve pain caused by an episiotomy, a tear, or swollen veins in the anus (hemorrhoids), take a warm sitz bath 2-4 times a day, or as many as told by your health care provider. You can use a  bathtub or a basin you put over the toilet as a sitz bath. Breast care In the first few days after delivery, your breasts may feel heavy, full, and uncomfortable (breast engorgement). Milk may also leak from your breasts. Ask your health care provider about ways to help relieve the discomfort. If you breastfeed: Wear a bra that supports your breasts and fits well. Use breast pads to absorb milk that leaks. Keep your nipples clean and dry. Apply creams and ointments as told. You may have uterine contractions every time you breastfeed for up to several weeks after delivery. This helps your uterus return to its normal size. If you have any problems with breastfeeding, tell your health care provider or lactation specialist. If you do not breastfeed: Avoid touching your breasts. Do not squeeze out (express) milk. Doing this can make your breasts produce more milk. Wear a bra that supports your breasts and fits well. Use cold packs to help with swelling. Talk to your health care provider about what over-the-counter medicines can help with pain. Intimacy and sexuality Ask your health care provider when you can engage in sexual activity. This may depend upon: Your risk of infection. How fast you are healing. Your comfort and desire to engage in sexual activity. You are able to get pregnant after delivery, even if you have not had your period. Talk with your health care provider about birth control (contraception) or family planning. Medicines Take over-the-counter and prescription medicines only as told by your health care provider. Take an over-the-counter stool softener to help  ease bowel movements as told by your health care provider. If you were prescribed antibiotics, take them as told by your health care provider. Do not stop using the antibiotic even if you start to feel better. Review all previous and current prescriptions to check for the possible transfer into your breast milk. Ask your  health care provider or lactation specialist for help if needed. Activity Gradually return to your normal activities as told by your health care provider. Rest as much as possible. Try to nap while your baby is sleeping. Eating and drinking  Drink enough fluid to keep your urine pale yellow. To help prevent or relieve constipation, eat high-fiber foods every day. Choose healthy eating to support breastfeeding and healing. Take your prenatal vitamins until your health care provider tells you to stop. General recommendations Do not use any products that contain nicotine or tobacco. These products include cigarettes, chewing tobacco, and vaping devices, such as e-cigarettes. These can delay healing. If you need help quitting, ask your health care provider. Secondhand smoke exposure is dangerous for your baby. It can increase the risk of illness and sudden infant death syndrome (SIDS). Nicotine and other chemicals pass through breast milk to the baby. Alcohol use can be dangerous during the postpartum period. Drinking alcohol may impair your judgment and ability to safely care for your baby. Not drinking alcohol is the safest option for breastfeeding mothers. Alcohol passes through breast milk to the baby. Alcohol can be damaging to your baby's development, growth, and sleep patterns. If you breastfeed and drink alcohol, wait at least 2 hours after a single drink before feeding your baby. Do not take medicines or drugs that are not prescribed to you, especially if you breastfeed. Visit your health care provider for a postpartum checkup within the first 3-6 weeks after delivery. Complete a comprehensive postpartum visit no later than 12 weeks after delivery. Keep all follow-up visits. Your health care provider will check your healing after delivery and also check your blood pressure. Where to find more information U.S. Department of Health and Cytogeneticist of Women's Health:  TravelLesson.ca [employer]ians [employer]necologists: acog.org Contact a health care provider if: You feel unusually sad or worried. Your breasts become red, painful, or hard. You have nausea and vomiting and are unable to eat or drink anything for 24 hours. You have a fever or other signs of infection. You have bleeding that soaks through one pad an hour or you have blood clots the size of an egg or larger. You have a severe headache that does not go away or you have a headache with vision changes. Get help right away if: You have chest pain or difficulty breathing. You have sudden, severe leg pain. You faint or have a seizure. You have thoughts about hurting yourself or your baby. You have any of the following symptoms and you were unable to reach your health care provider: A fever or other signs of infection. Bleeding that is soaking through one pad an hour or you have blood clots the size of an egg or larger. A severe headache that does not go away or you have a headache with vision changes. These symptoms may be an emergency. Get help right away. Call 911. Do not wait to see if the symptoms will go away. Do not drive yourself to the hospital. Get help if you ever feel like you may hurt yourself or others, or have thoughts about taking your own life. Go  to your nearest emergency room or: Call 911. Call the National Suicide Prevention Lifeline at 315-702-5467 or 988. This is open 24 hours a day. Text the Crisis Text Line at 507-826-2619. This information is not intended to replace advice given to you by your health care provider. Make sure you discuss any questions you have with your health care provider. Document Revised: 01/12/2022 Document Reviewed: 07/05/2021 Elsevier Patient Education  2024 [employer].

## 2024-02-06 NOTE — Progress Notes (Unsigned)
 Virtual Visit via Video Note  I connected with Tara Rose on 02/08/24 at  10:35 AM EDT by telephone and verified that I am speaking with the correct person using two identifiers.  Location: Patient: at home Provider: Office   I discussed the limitations of evaluation and management by telemedicine and the availability of in person appointments. The patient expressed understanding and agreed to proceed.    History of Present Illness:   Tara Rose is a 33 y.o. G57P1001 female who presents for a 2 week televisit for mood check. She is 2 weeks postpartum following a spontaneous vaginal delivery.  The delivery was at 37 gestational weeks.  Postpartum course has been well so far. Baby is feeding by breast and formula (Similac Total 360. Not sure how much longer she will breastfeed. Supply is low. Bleeding: light. Postpartum depression screening: negative.  EDPS score is 0.   She reports some leakage of urine since delivery. Reviewed pelvic floor issues following pregnancy and importance of kegel exercises. Still having some pain in perineum. Suggested sea salt tub soaks. She is wondering how long she will need to take metformin . Reports fasting blood sugars in 70s now. Previously she was able to control her type 2 diabetes with lifestyle modifications. Suggested a trial of discontinuing the medication and check BS including fasting to see if she is ready to stop. Reviewed the importance of blood sugar control.      The following portions of the patient's history were reviewed and updated as appropriate: allergies, current medications, past family history, past medical history, past social history, past surgical history, and problem list.   Observations/Objective:   Height 5' 1 (1.549 m), weight 173 lb (78.5 kg), currently breastfeeding. Gen App: NAD Psych: normal speech, affect. Good mood.        02/08/2024   10:44 AM 01/25/2024    9:30 PM  Edinburgh Postnatal Depression Scale  Screening Tool  I have been able to laugh and see the funny side of things. 0 0   I have looked forward with enjoyment to things. 0 0   I have blamed myself unnecessarily when things went wrong. 0 0   I have been anxious or worried for no good reason. 0 0   I have felt scared or panicky for no good reason. 0 0   Things have been getting on top of me. 0 0   I have been so unhappy that I have had difficulty sleeping. 0 0   I have felt sad or miserable. 0 0   I have been so unhappy that I have been crying. 0 0   The thought of harming myself has occurred to me. 0 0   Edinburgh Postnatal Depression Scale Total 0 0     Data saved with a previous flowsheet row definition        Assessment and Plan:   1. Postpartum care following vaginal delivery (Primary)  2. Encounter for screening for maternal depression  3. 2 weeks postpartum follow-up    Follow Up Instructions:     I discussed the assessment and treatment plan with the patient. The patient was provided an opportunity to ask questions and all were answered. The patient agreed with the plan and demonstrated an understanding of the instructions.   The patient was advised to call back or seek an in-person evaluation if the symptoms worsen or if the condition fails to improve as anticipated.  Return in 4 weeks for 6  week postpartum visit    Slater Rains, CNM

## 2024-02-08 ENCOUNTER — Telehealth (INDEPENDENT_AMBULATORY_CARE_PROVIDER_SITE_OTHER): Admitting: Advanced Practice Midwife

## 2024-02-08 ENCOUNTER — Encounter: Payer: Self-pay | Admitting: Advanced Practice Midwife

## 2024-02-08 DIAGNOSIS — Z1332 Encounter for screening for maternal depression: Secondary | ICD-10-CM | POA: Diagnosis not present

## 2024-02-12 ENCOUNTER — Inpatient Hospital Stay: Admit: 2024-02-12 | Payer: Self-pay

## 2024-03-01 ENCOUNTER — Observation Stay
Admission: EM | Admit: 2024-03-01 | Discharge: 2024-03-01 | Disposition: A | Discharge status: Home / Self Care | Attending: Obstetrics and Gynecology | Admitting: Obstetrics and Gynecology

## 2024-03-01 ENCOUNTER — Other Ambulatory Visit: Payer: Self-pay

## 2024-03-01 ENCOUNTER — Encounter: Payer: Self-pay | Admitting: Certified Nurse Midwife

## 2024-03-01 ENCOUNTER — Encounter: Payer: Self-pay | Admitting: Advanced Practice Midwife

## 2024-03-01 DIAGNOSIS — O1495 Unspecified pre-eclampsia, complicating the puerperium: Secondary | ICD-10-CM | POA: Insufficient documentation

## 2024-03-01 DIAGNOSIS — O133 Gestational [pregnancy-induced] hypertension without significant proteinuria, third trimester: Principal | ICD-10-CM | POA: Insufficient documentation

## 2024-03-01 DIAGNOSIS — Z3A37 37 weeks gestation of pregnancy: Secondary | ICD-10-CM | POA: Insufficient documentation

## 2024-03-01 DIAGNOSIS — O165 Unspecified maternal hypertension, complicating the puerperium: Principal | ICD-10-CM

## 2024-03-01 LAB — COMPREHENSIVE METABOLIC PANEL WITH GFR
ALT: 9 U/L (ref 0–44)
AST: 13 U/L — ABNORMAL LOW (ref 15–41)
Albumin: 4.3 g/dL (ref 3.5–5.0)
Alkaline Phosphatase: 61 U/L (ref 38–126)
Anion gap: 10 (ref 5–15)
BUN: 10 mg/dL (ref 6–20)
CO2: 23 mmol/L (ref 22–32)
Calcium: 9.2 mg/dL (ref 8.9–10.3)
Chloride: 107 mmol/L (ref 98–111)
Creatinine, Ser: 0.59 mg/dL (ref 0.44–1.00)
GFR, Estimated: >60 mL/min (ref >60)
Glucose, Bld: 82 mg/dL (ref 70–99)
Potassium: 3.5 mmol/L (ref 3.5–5.1)
Sodium: 140 mmol/L (ref 135–145)
Total Bilirubin: 0.6 mg/dL (ref 0.0–1.2)
Total Protein: 7.4 g/dL (ref 6.5–8.1)

## 2024-03-01 LAB — CBC
HCT: 38.3 % (ref 36.0–46.0)
Hemoglobin: 12.9 g/dL (ref 12.0–15.0)
MCH: 28.4 pg (ref 26.0–34.0)
MCHC: 33.7 g/dL (ref 30.0–36.0)
MCV: 84.2 fL (ref 80.0–100.0)
Platelets: 260 K/uL (ref 150–400)
RBC: 4.55 MIL/uL (ref 3.87–5.11)
RDW: 13.1 % (ref 11.5–15.5)
WBC: 10.1 K/uL (ref 4.0–10.5)
nRBC: 0 % (ref 0.0–0.2)

## 2024-03-01 LAB — PROTEIN / CREATININE RATIO, URINE
Creatinine, Urine: 17 mg/dL
Total Protein, Urine: <6 mg/dL

## 2024-03-01 NOTE — OB Triage Note (Signed)
 Pt is being discharged stable and ambulatory per provider order. Pt educated on signs of hypertension and when to return to hospital. Discharge instructions provided and reviewed with the patient.

## 2024-03-01 NOTE — OB Triage Note (Signed)
 Pt is a G1P1 who delivered on 01/25/24 vaginally. She presents to L/D triage due to elevated BP. She stated she started feeling funny last night. She took her blood pressure at home this morning and reports elevated readings. She came to the ED and reports a reading of 140/95 while being seen there.  She stated that she noticed spotty vision which occurred a few days ago and is still ongoing. She complains of feeling dizzy & experiencing an ear ringing sensation.  She denies HA, RUQ pain. She stated that she has a soreness in her left shoulder rated 6/10 but is attributing this soreness to holding her baby.  Labs collected and sent to pharmacy.  [name] CNM aware of pt arrival.

## 2024-03-01 NOTE — Discharge Summary (Addendum)
 Obstetric Discharge Summary  Reason for Admission: observation admission, [facility] evaluation for elevated blood pressure postpartum Prenatal Procedures: GDM A2 Intrapartum Procedures: induction for GDM A2 at 37 weeks Postpartum Procedures: uncomplicated postpartum course  Complications-Operative and Postpartum: Patient is 5 weeks postpartum SVD. She reports sparkles in her vision for the past few days, along with complaints of feeling dizzy and a ringing in her ears. She and her husband both had a viral illness in the past week. She was generally not feeling well last night and checked her blood pressure which was elevated. In the ED this morning she had a MRBP reading. Admitted to L&D observation for [facility] evaluation. Blood pressures and labs are all wnl and reassuring. Patient also admits she is not getting enough sleep and may not be hydrating well enough. She is not taking an iron supplement. Advised that she increase hydration, take iron supplement and increase sleep. She is discharged to home with instructions and precautions. Has 6 week postpartum visit next week.   Hemoglobin  Date Value Ref Range Status  03/01/2024 12.9 12.0 - 15.0 g/dL Final  91/95/7974 88.1 11.1 - 15.9 g/dL Final   HCT  Date Value Ref Range Status  03/01/2024 38.3 36.0 - 46.0 % Final   Hematocrit  Date Value Ref Range Status  11/19/2023 34.7 34.0 - 46.6 % Final    Latest Reference Range & Units 03/01/24 11:32 03/01/24 12:27  Comprehensive metabolic panel with GFR   Rpt !  Sodium 135 - 145 mmol/L  140  Potassium 3.5 - 5.1 mmol/L  3.5  Chloride 98 - 111 mmol/L  107  CO2 22 - 32 mmol/L  23  Glucose 70 - 99 mg/dL  82  BUN 6 - 20 mg/dL  10  Creatinine 9.55 - 1.00 mg/dL  9.40  Calcium 8.9 - 89.6 mg/dL  9.2  Anion gap 5 - 15   10  Alkaline Phosphatase 38 - 126 U/L  61  Albumin 3.5 - 5.0 g/dL  4.3  AST 15 - 41 U/L  13 (L)  ALT 0 - 44 U/L  9  Total Protein 6.5 - 8.1 g/dL  7.4  Total Bilirubin 0.0 - 1.2 mg/dL  0.6   GFR, Estimated >39 mL/min  >60  WBC 4.0 - 10.5 K/uL  10.1  RBC 3.87 - 5.11 MIL/uL  4.55  Hemoglobin 12.0 - 15.0 g/dL  87.0  HCT 63.9 - 53.9 %  38.3  MCV 80.0 - 100.0 fL  84.2  MCH 26.0 - 34.0 pg  28.4  MCHC 30.0 - 36.0 g/dL  66.2  RDW 88.4 - 84.4 %  13.1  Platelets 150 - 400 K/uL  260  nRBC 0.0 - 0.2 %  0.0  Total Protein, Urine mg/dL <6   Protein Creatinine Ratio 0.00 - 0.15 mg/mgCre NOT CALCULATED   Creatinine, Urine mg/dL 17   !: Data is abnormal (L): Data is abnormally low Rpt: View report in Results Review for more information  Physical Exam: BP 128/77   Pulse 81   Temp 97.7 F (36.5 C) (Oral)   Ht 5' 1 (1.549 m)   Wt 78.9 kg   SpO2 99%   BMI 32.88 kg/m    General: alert, cooperative, appears stated age, and no distress Lochia: no bleeding  DVT Evaluation: No evidence of DVT seen on physical exam.  Patient Vitals for the past 24 hrs:  BP Temp Temp src Pulse SpO2 Height Weight  03/01/24 1349 128/77 -- -- 81 -- -- --  03/01/24 1334 119/75 -- -- 86 -- -- --  03/01/24 1320 122/81 -- -- 77 -- -- --  03/01/24 1305 123/85 -- -- 78 -- -- --  03/01/24 1250 133/79 -- -- 82 -- -- --  03/01/24 1235 129/89 -- -- 73 -- -- --  03/01/24 1219 (!) 125/95 -- -- 83 -- -- --  03/01/24 1204 126/86 -- -- 83 -- -- --  03/01/24 1140 -- -- -- -- -- 5' 1 (1.549 m) 78.9 kg  03/01/24 1135 (!) 136/100 97.7 F (36.5 C) Oral 86 -- -- --  03/01/24 1109 -- -- -- -- -- 5' 1 (1.549 m) 78.9 kg  03/01/24 1108 (!) 140/95 99.2 F (37.3 C) Oral 93 99 % -- --     Discharge Diagnoses: normal labs and blood pressure, not preeclamptic  Discharge Information: Date: 03/01/2024 Activity: unrestricted Diet: routine Medications: None Condition: stable Instructions: refer to after visit summary Discharge to: home  Follow-up Information     [facility] [place]. Go to.   Specialty: Obstetrics and Gynecology Why: scheduled postpartum visit Contact information: [address] [place]  [phone]               Time spent caring for this patient: 30 minutes  Slater Rains, CNM 03/01/2024, 2:34 PM

## 2024-03-04 ENCOUNTER — Encounter: Payer: Self-pay | Admitting: Obstetrics & Gynecology

## 2024-03-04 ENCOUNTER — Ambulatory Visit (INDEPENDENT_AMBULATORY_CARE_PROVIDER_SITE_OTHER): Admitting: Obstetrics & Gynecology

## 2024-03-04 MED ORDER — NORELGESTROMIN-ETH ESTRADIOL 150-35 MCG/24HR TD PTWK: 1.0000 | MEDICATED_PATCH | TRANSDERMAL | 12 refills | Status: AC

## 2024-03-04 NOTE — Progress Notes (Signed)
 Subjective:     Tara Rose is a 33 y.o. P1  who presents for a postpartum visit. She is 5 1/2  weeks postpartum following a spontaneous vaginal delivery. I have fully reviewed the prenatal and intrapartum course. She had IOL at 37 weeks for G2DM on metformin . She had a NSVD with 1st degree laceration. She was then back in the hospital last week for observation due to [facility] evaluation. Her labs were normal and she was discharged home. She is breast feeding  Bowel function is normal. Bladder function is normal. Patient is not sexually active. Contraception method is abstinence. Postpartum depression screening: negative.  The following portions of the patient's history were reviewed and updated as appropriate: allergies, current medications, past family history, past medical history, past social history, past surgical history, and problem list.  Review of Systems Pertinent items are noted in HPI.  Her pap was normal 07/2023.  Objective:   Well nourished, well hydrated Black female, no apparent distress She is ambulating and conversing normally. Abd- benign EG- normal, stitches gone, healed well Assessment:    Normal postpartum exam. Pap smear not done at today's visit.   Plan:    1. Contraception: birth control patch, condoms for the first week 2. She will follow up with her primary care for her Type 2 DM. She checks her fasting sugars and get A1C q 3 months with her primary care.

## 2024-03-05 ENCOUNTER — Encounter: Payer: Self-pay | Admitting: Nurse Practitioner

## 2024-03-07 ENCOUNTER — Ambulatory Visit: Admitting: Certified Nurse Midwife

## 2024-03-11 ENCOUNTER — Encounter: Payer: Self-pay | Admitting: Nurse Practitioner

## 2024-03-11 ENCOUNTER — Ambulatory Visit (INDEPENDENT_AMBULATORY_CARE_PROVIDER_SITE_OTHER): Payer: Self-pay | Admitting: Nurse Practitioner

## 2024-03-11 VITALS — BP 132/82 | HR 86 | Temp 98.0°F | Ht 61.0 in | Wt 175.0 lb

## 2024-03-11 DIAGNOSIS — R030 Elevated blood-pressure reading, without diagnosis of hypertension: Secondary | ICD-10-CM

## 2024-03-11 DIAGNOSIS — E782 Mixed hyperlipidemia: Secondary | ICD-10-CM

## 2024-03-11 DIAGNOSIS — E1165 Type 2 diabetes mellitus with hyperglycemia: Secondary | ICD-10-CM

## 2024-03-11 LAB — POCT GLYCOSYLATED HEMOGLOBIN (HGB A1C): Hemoglobin A1C: 5.2 % (ref 4.0–5.6)

## 2024-03-11 NOTE — Progress Notes (Signed)
 BP 132/88   Pulse 86   Temp 98 F (36.7 C)   Ht 5' 1 (1.549 m)   Wt 175 lb (79.4 kg)   SpO2 99%   Breastfeeding No   BMI 33.07 kg/m    Subjective:    Patient ID: Tara Rose, female    DOB: [birth year], 33 y.o.   MRN: [id number]  HPI: Tara Rose is a 33 y.o. female  Chief Complaint  Patient presents with   Hypertension   Discussed the use of AI scribe software for clinical note transcription with the patient, who gave verbal consent to proceed.  History of Present Illness Tara Rose is a 33 year old female with type 2 diabetes who presents with elevated blood pressure readings postpartum.  Postpartum hypertension - Elevated blood pressure readings postpartum following delivery on January 25, 2024 - Blood pressure during hospital stay ranged from 103/71 to 96/57 - After discharge, blood pressure increased with a reading of 140/95 on March 01, 2024 (emergency department visit) - Blurry vision occurred a few days prior to ED visit; preeclampsia was ruled out by [facility] triage - Subsequent blood pressure readings as high as 136/100 - Blood pressure at [facility] appointment on March 04, 2024 was 102/67 - Home blood pressure monitoring shows readings from 130/99 to 112/90 - Current blood pressure is 132/88 - Consuming beetroot juice, which she believes helps stabilize blood pressure, but did not consume it today to test its effect  Neurological symptoms - Intermittent tinnitus - Slight dizziness described as lightheadedness, similar to sensation of not eating, despite eating small amounts throughout the day - Numbness in legs, feet, and arms, described as neuropathic in nature  Appetite and hydration - Lack of appetite - Not breastfeeding due to low milk production - Eating small amounts but not full meals - Drinking approximately four bottles of water daily  Physical activity - Limited physical activity due to cold weather and concerns about RSV  Dietary  supplements and alternative therapies - Daily intake of apple cider vinegar with water - Not taking prenatal vitamins due to depressive symptoms, plans to try a different brand  Type 2 diabetes mellitus - Diet-controlled type 2 diabetes - Last hemoglobin A1c in April was 5.2 - Blood glucose levels have been stable - Previously on metformin , discontinued due to concerns about medication use during pregnancy         03/11/2024   11:23 AM 02/05/2023    2:40 PM 11/24/2022    3:18 PM  Depression screen PHQ 2/9  Decreased Interest 0 0 0  Down, Depressed, Hopeless 0 0 0  PHQ - 2 Score 0 0 0    Relevant past medical, surgical, family and social history reviewed and updated as indicated. Interim medical history since our last visit reviewed. Allergies and medications reviewed and updated.  Review of Systems  Ten systems reviewed and is negative except as mentioned in HPI      Objective:      BP 132/88   Pulse 86   Temp 98 F (36.7 C)   Ht 5' 1 (1.549 m)   Wt 175 lb (79.4 kg)   SpO2 99%   Breastfeeding No   BMI 33.07 kg/m    Wt Readings from Last 3 Encounters:  03/11/24 175 lb (79.4 kg)  03/04/24 174 lb 6.4 oz (79.1 kg)  03/01/24 174 lb (78.9 kg)    Physical Exam VITALS: BP- 132/88 MEASUREMENTS: Weight- 175. GENERAL: Alert, cooperative, well developed,  no acute distress. HEENT: Normocephalic, normal oropharynx, moist mucous membranes, ears normal. CHEST: Clear to auscultation bilaterally, no wheezes, rhonchi, or crackles. CARDIOVASCULAR: Normal heart rate and rhythm, S1 and S2 normal without murmurs. ABDOMEN: Soft, non-tender, non-distended, without organomegaly, normal bowel sounds. EXTREMITIES: No cyanosis or edema. NEUROLOGICAL: Cranial nerves grossly intact, moves all extremities without gross motor or sensory deficit.  Results for orders placed or performed in visit on 03/11/24  POCT HgB A1C   Collection Time: 03/11/24 11:51 AM  Result Value Ref Range    Hemoglobin A1C 5.2 4.0 - 5.6 %   HbA1c POC (<> result, manual entry)     HbA1c, POC (prediabetic range)     HbA1c, POC (controlled diabetic range)            Assessment & Plan:   Problem List Items Addressed This Visit   None Visit Diagnoses       Type 2 diabetes mellitus with hyperglycemia, without long-term current use of insulin  (HCC)    -  Primary   Relevant Orders   POCT HgB A1C (Completed)     Mixed hyperlipidemia         Elevated blood pressure reading            Assessment and Plan Assessment & Plan Elevated blood pressure, postpartum Postpartum elevated blood pressure with readings ranging from 130/99 to 112/90. Blood pressure improved with beetroot juice. No current antihypertensive medication due to risk of hypotension and potential falls, especially with a newborn. Stress and anxiety may contribute to elevated readings. - Continue beetroot juice for blood pressure management. - Encouraged hydration with at least four bottles of water daily. - Rechecked blood pressure before leaving the clinic. - Scheduled follow-up in three months.  Type 2 diabetes mellitus, diet controlled Type 2 diabetes mellitus well-controlled with diet. Last A1c was 5.2 in April. No recent glucose testing due to dissatisfaction with previous provider's approach. Reports poor dietary habits but blood sugar levels remain stable. - Checked A1c today. - Encouraged healthy eating habits. - Scheduled follow-up in three months with full panel of lab work including A1c.  Peripheral neuropathy, under evaluation Reports numbness in legs, feet, and arms, suggestive of peripheral neuropathy. Symptoms may be related to recent childbirth or diabetes. - Monitor symptoms and reassess in three months. - Encouraged hydration and healthy diet.  Tinnitus, under evaluation Intermittent tinnitus since noticing elevated blood pressure. Symptoms fluctuate in intensity. No ear abnormalities on examination.  Possible correlation with blood pressure changes.  Postpartum appetite loss and fatigue Experiencing decreased appetite and fatigue since childbirth. Reports lack of hunger and difficulty eating full meals. Symptoms may be related to hormonal changes postpartum. - Encouraged small, frequent meals. - Encouraged hydration with at least four bottles of water daily. - Scheduled follow-up in three months.        Follow up plan: Return in about 3 months (around 06/11/2024) for follow up, full panel of labs.

## 2024-03-20 ENCOUNTER — Encounter: Admitting: Nurse Practitioner

## 2024-06-11 ENCOUNTER — Ambulatory Visit: Admitting: Nurse Practitioner
